# Patient Record
Sex: Male | Born: 1950
Health system: Southern US, Community
[De-identification: ages and names within clinical notes are randomized; demographics above are authoritative.]

## PROBLEM LIST (undated history)

## (undated) DIAGNOSIS — Z87442 Personal history of urinary calculi: Secondary | ICD-10-CM

## (undated) DIAGNOSIS — M1990 Unspecified osteoarthritis, unspecified site: Secondary | ICD-10-CM

## (undated) DIAGNOSIS — K219 Gastro-esophageal reflux disease without esophagitis: Secondary | ICD-10-CM

## (undated) DIAGNOSIS — N209 Urinary calculus, unspecified: Secondary | ICD-10-CM

## (undated) DIAGNOSIS — E041 Nontoxic single thyroid nodule: Secondary | ICD-10-CM

## (undated) DIAGNOSIS — Z973 Presence of spectacles and contact lenses: Secondary | ICD-10-CM

## (undated) DIAGNOSIS — E785 Hyperlipidemia, unspecified: Secondary | ICD-10-CM

## (undated) HISTORY — DX: Hyperlipidemia, unspecified: E78.5

## (undated) HISTORY — PX: UVULOPALATOPHARYNGOPLASTY: SHX827

---

## 1998-02-19 ENCOUNTER — Ambulatory Visit (HOSPITAL_COMMUNITY): Admission: RE | Admit: 1998-02-19 | Discharge: 1998-02-19 | Payer: Self-pay | Admitting: Gastroenterology

## 2000-02-17 ENCOUNTER — Ambulatory Visit (HOSPITAL_COMMUNITY): Admission: RE | Admit: 2000-02-17 | Discharge: 2000-02-17 | Payer: Self-pay | Admitting: Gastroenterology

## 2000-02-17 ENCOUNTER — Encounter (INDEPENDENT_AMBULATORY_CARE_PROVIDER_SITE_OTHER): Payer: Self-pay | Admitting: Specialist

## 2008-09-05 DIAGNOSIS — Z8669 Personal history of other diseases of the nervous system and sense organs: Secondary | ICD-10-CM

## 2008-09-05 HISTORY — DX: Personal history of other diseases of the nervous system and sense organs: Z86.69

## 2008-11-21 ENCOUNTER — Ambulatory Visit (HOSPITAL_BASED_OUTPATIENT_CLINIC_OR_DEPARTMENT_OTHER): Admission: RE | Admit: 2008-11-21 | Discharge: 2008-11-21 | Payer: Self-pay | Admitting: Otolaryngology

## 2008-11-30 ENCOUNTER — Ambulatory Visit: Payer: Self-pay | Admitting: Internal Medicine

## 2009-01-12 ENCOUNTER — Ambulatory Visit (HOSPITAL_COMMUNITY): Admission: RE | Admit: 2009-01-12 | Discharge: 2009-01-13 | Payer: Self-pay | Admitting: Otolaryngology

## 2009-01-12 ENCOUNTER — Encounter (INDEPENDENT_AMBULATORY_CARE_PROVIDER_SITE_OTHER): Payer: Self-pay | Admitting: Otolaryngology

## 2009-01-12 HISTORY — PX: UVULOPALATOPHARYNGOPLASTY (UPPP)/TONSILLECTOMY/SEPTOPLASTY: SHX6164

## 2009-04-18 ENCOUNTER — Encounter: Admission: RE | Admit: 2009-04-18 | Discharge: 2009-04-18 | Payer: Self-pay | Admitting: Family Medicine

## 2010-12-14 LAB — CBC
HCT: 47.6 % (ref 39.0–52.0)
Hemoglobin: 16.5 g/dL (ref 13.0–17.0)
MCHC: 34.8 g/dL (ref 30.0–36.0)
MCV: 97.3 fL (ref 78.0–100.0)
Platelets: 193 K/uL (ref 150–400)
RBC: 4.89 MIL/uL (ref 4.22–5.81)
RDW: 11.8 % (ref 11.5–15.5)
WBC: 6.2 K/uL (ref 4.0–10.5)

## 2010-12-14 LAB — BASIC METABOLIC PANEL
CO2: 26 mEq/L (ref 19–32)
Glucose, Bld: 126 mg/dL — ABNORMAL HIGH (ref 70–99)
Potassium: 4.2 mEq/L (ref 3.5–5.1)
Sodium: 139 mEq/L (ref 135–145)

## 2010-12-14 LAB — BASIC METABOLIC PANEL WITH GFR
BUN: 17 mg/dL (ref 6–23)
Calcium: 9.4 mg/dL (ref 8.4–10.5)
Chloride: 108 meq/L (ref 96–112)
Creatinine, Ser: 1.05 mg/dL (ref 0.4–1.5)
GFR calc Af Amer: >60 mL/min (ref >60)
GFR calc non Af Amer: >60 mL/min (ref >60)

## 2011-01-18 NOTE — Op Note (Signed)
NAME:  Casey Mullen, Casey Mullen NO.:  [id number]   MEDICAL RECORD NO.:  [id number]          PATIENT TYPE:  OIB   LOCATION:  3305                         FACILITY:  [facility]   PHYSICIAN:  Hermelinda Medicus, M.D.   DATE OF BIRTH:  1951-05-11   DATE OF PROCEDURE:  01/12/2009  DATE OF DISCHARGE:                               OPERATIVE REPORT   The patient is aware of the risk and gains, the risk of bleeding  postoperatively, the risk of congestion.  He will be kept overnight as  well and he is going to have a very sore throat for about 10 days and  also cannot travel long distance and also he is going to have a very  soft diet for approximately 10-12 days.   PREOPERATIVE DIAGNOSES:  Sleep apnea with septal deviation with  turbinate hypertrophy with tonsillar hypertrophy.   POSTOPERATIVE DIAGNOSES:  Sleep apnea with septal deviation with  turbinate hypertrophy with tonsillar hypertrophy.   OPERATION:  Septal reconstruction and turbinate reduction,  tonsillectomy, and uvulopalatoplasty.   OPERATOR:  Hermelinda Medicus, MD   ANESTHESIA:  General endotracheal with Dr. Krista Blue.   PROCEDURE:  The patient was placed in supine position under general  endotracheal anesthesia.  The nose was further anesthetized using 1%  Xylocaine with epinephrine and topical cocaine of turbinates.  We then  reduced using the butter knife then outfractured considerably.  The  concha bullosa were also noted and they were decreased in size using the  polyp forceps, but no mucous membrane was removed.  The septum was then  approached through a hemitransfixion incision on the right side carried  around the columella, and the columella was deviated on right angles out  of the columella normal position and this was replaced back in its  original position after the scar tissue was clear.  The ethmoid and  vomerine septum was also approached using the elevated mucous membrane  and then the Glorious Peach was used to take  anterior quadrilateral cartilage  strip and then the postethmoid and vomerine region was taken in its  deviated area with the open and close [employer].  Once this was  achieved, the septum was established to the midline.  The columella was  sutured in place using the 5-0 plain catgut and the incision was closed  with 4-0 plain catgut and then 5-0 plain catgut was again used as a  through-and-through septal suture back in the ethmoid vomerine region to  minimize any swelling or hematoma.  Telfa was placed within the nose,  and attention was then carried to the oral cavity where the Crowe-Davis  mouth gag was placed and the tonsils were removed using blunt, sharp,  and Bovie electrocoagulation dissection and hemostasis.  Once these were  removed and the uvula and palate was trimmed raising the contour of the  palate approximately 7 mm and the uvula was approximately 3 times as  normal size and this was taken down through a more normal size.  Once  this was completed, the anterior posterior mucous membrane was sutured  using 5-0 plain catgut  in the uvula.  The tonsillar beds were again  checked.  The stomach was suctioned.  The Telfa was removed from his nose and  anesthesia trumpets were placed, and the patient was awakened, tolerated  the procedure well.  He was doing well postoperatively.  He will be kept  overnight 23-hour observation and then would be seeing me in 5 days, 10  days, 3 weeks, 6 weeks, 3 months and 6 months.           ______________________________  Hermelinda Medicus, M.D.     JC/MEDQ  D:  01/12/2009  T:  01/13/2009  Job:  [id number]   cc:   Dario Guardian, M.D.

## 2011-01-18 NOTE — Procedures (Signed)
NAME:  Casey Mullen, Casey Mullen NO.:  [id number]   MEDICAL RECORD NO.:  [id number]          PATIENT TYPE:  OUT   LOCATION:  SLEEP CENTER                 FACILITY:  [facility]   PHYSICIAN:  Clinton D. Maple Hudson, MD, FCCP, FACPDATE OF BIRTH:  05-[birth year]-49   DATE OF STUDY:  11/21/2008                            NOCTURNAL POLYSOMNOGRAM   REFERRING PHYSICIAN:  Hermelinda Medicus, M.D.   INDICATION FOR STUDY:  Hypersomnia with sleep apnea.   EPWORTH SLEEPINESS SCORE:  2/24.   BMI 33.7.  Weight 235 pounds.   HOME MEDICATIONS:  None listed.   SLEEP ARCHITECTURE:  Total sleep time 207 minutes with sleep efficiency  54.6%.  Stage I was 15.9%, stage II 78%, stage III absent.  REM 6% of  total sleep time.  Sleep latency 16 minutes.  REM latency 164.5 minutes.  Awake after sleep onset 155 minutes.  Arousal index 29.9.   No bedtime medication was taken.   RESPIRATORY DATA:  Apnea-hypopnea index (AHI) 30.4 per hour.  A total of  105 events was scored including 68 obstructive apneas and 37 hypopneas.  Most of the events were recorded as non-supine.  REM AHI 67.2.  This was  a diagnostic NPSG protocol as ordered.   OXYGEN DATA:  Moderate snoring with oxygen desaturation to a nadir of  80%.  Mean oxygen saturation through the study was 93% on room air.   CARDIAC DATA:  Normal sinus rhythm.   MOVEMENT/PARASOMNIA:  No significant movement disturbance.  No bathroom  trips.   IMPRESSION/RECOMMENDATIONS:  1. Moderate obstructive sleep apnea/hypopnea syndrome, apnea-hypopnea      index 30.4 per hour.  Moderate snoring with oxygen desaturation to      a nadir of 80%.  2. This is a diagnostic NPSG study.  The patient can return for CPAP      titration if appropriate.  Otherwise consider alternative      management as clinically indicated.      Clinton D. Maple Hudson, MD, [facility], FACP  Diplomate, Biomedical engineer of Sleep Medicine  Electronically Signed     CDY/MEDQ  D:  11/29/2008 16:28:41  T:   11/30/2008 01:29:26  Job:  [id number]

## 2011-01-18 NOTE — H&P (Signed)
NAME:  Casey Mullen, Casey Mullen NO.:  [id number]   MEDICAL RECORD NO.:  [id number]          PATIENT TYPE:  OIB   LOCATION:  3305                         FACILITY:  [facility]   PHYSICIAN:  Hermelinda Medicus, M.D.   DATE OF BIRTH:  1950/11/25   DATE OF ADMISSION:  01/12/2009  DATE OF DISCHARGE:                              HISTORY & PHYSICAL   This patient is a 60 year old male who enters with sleep apnea issues  where he has an RDI and AHI of 30.4, oxygen desaturation down to 80%.  He has missed a considerable amount of sleep, has limited deep sleep and  has a septal deviation with nasal obstruction with turbinate hypertrophy  with large tonsils and a low uvula and palate.  He now enters for a  tonsillectomy, uvulopalatoplasty, and septal reconstruction, turbinate  reduction.  His past history is one of allergies to PENICILLIN, causing  a rash.  In his past also he is fatigued.  He has driven off the road  and taken naps and is quite sleep deprived.  Remainder of his review of  systems is quite unremarkable.  He has had some capped teeth.  He also  had a drink socially, never smoked.  He had a stress test several years  ago, but was fully normal.  Stomach ulcer history, had a colonoscopy.  Left shoulder gives him a bit of stiffness, but otherwise he has been in  excellent condition.  His lab work does show a glucose level of 126.   PHYSICAL EXAMINATION:  VITAL SIGNS:  Blood pressure is 126/72.  HEENT:  The ears are clear.  The tympanic membranes are clear and moved  well.  The oral cavity shows the large tonsils with low uvula and  palate, and the septum right anteriorly goes to columella way over to  the left and back to the right, it is almost at right angles.  The  remainder of the septum back further of the ethmoid and vomerine region  is also to the right turbinate hypertrophy as well.  The tonsils are  moderately large in size.  The palate was low.  The larynx was clear.  True cords, false cords, epiglottis, base of tongue, and lateral  pharyngeal walls were all clear of ulceration or mass, and true cord  mobility, gag reflex, tongue mobility, EOMs, and facial nerve were all  symmetrical, as is shoulder strength neurologically intact.  NECK:  Free of any thyromegaly, cervical adenopathy, or mass, but is  quite full.  CHEST:  Clear.  No rales, rhonchi, or wheezes.  CARDIOVASCULAR:  Normal S1 and S2.  No murmurs, rubs, or gallops.  EKG  noted.  EXTREMITIES:  Unremarkable.   INITIAL DIAGNOSIS:  Sleep apnea with septal deviation, turbinate  hypertrophy with tonsillar hypertrophy.           ______________________________  Hermelinda Medicus, M.D.     JC/MEDQ  D:  01/12/2009  T:  01/13/2009  Job:  [id number]   cc:   Dario Guardian, M.D.

## 2011-01-21 NOTE — Op Note (Signed)
[facility]  Patient:    Casey Mullen, Casey Mullen                        MRN: [id number] Proc. Date: 02/17/00 Adm. Date:  [id number] Disc. Date: [id number] Attending:  Orland Mustard CC:         Dario Guardian, M.D.                           Operative Report  PROCEDURE:  Esophagogastroduodenoscopy with biopsy and polypectomy.  MEDICATIONS:  Hurricane spray, fentanyl 75 mcg, Versed 7 mg IV.  INDICATION FOR PROCEDURE:  A nice 60 year old gentleman who had heme positive stool, upper abdominal pain and has a previous history of ulcers. When we saw him in the office with this history, he had had been on a proton pump inhibitor but with the development of pain and positive stools, I felt another endoscopy was appropriate.  DESCRIPTION OF PROCEDURE:  The procedure had been explained to the patient and consent obtained. With the patient in the left lateral decubitus position, the Olympus adult video endoscope was inserted blindly into the esophagus. The distal esophagus was quite reddened. There was a hiatal hernia, it was patent with free reflux. The stomach was entered, pylorus identified and passed. There was mild duodenitis with several erosions in the bulb. There were no ulcerations. The second duodenum was normal. The scope was withdrawn back in the stomach. The pyloric channel was normal. Biopsy of the antrum was taken for rapid urease test for Helicobacter. Near the incisor was a 3/4 cm sessile polyp that was ulcerated. In view of this heme positive stool, I felt that this should be remove since there was clearly and ulceration on the polyp. The mini snare was used, polyp was removed and sucked through the scope. There was no active bleeding at the time. The fundus and cardia were seen well on the retroflexed view. The scope was withdrawn. Again the findings of a hiatal hernia and a reddened esophagus were  confirmed upon withdrawal and no other lesions were  seen. The patient was maintained on low flow oxygen and pulse oximeter throughout the procedure with no obvious problem.  ASSESSMENT: 1. Hiatal hernia with gastroesophageal reflux disease. 2. Gastric polyp removed. 3. Duodenitis.  PLAN:  Will continue Nexium. See back in the office in 1 month and recheck his stools. DD:  02/17/00 TD:  02/19/00 Job: [id number]

## 2012-01-24 ENCOUNTER — Other Ambulatory Visit: Payer: Self-pay | Admitting: Family Medicine

## 2012-01-24 ENCOUNTER — Ambulatory Visit
Admission: RE | Admit: 2012-01-24 | Discharge: 2012-01-24 | Disposition: A | Discharge status: Home / Self Care | Payer: BC Managed Care – PPO | Source: Ambulatory Visit | Attending: Family Medicine | Admitting: Family Medicine

## 2012-01-24 ENCOUNTER — Other Ambulatory Visit: Payer: Self-pay | Admitting: *Deleted

## 2012-01-24 DIAGNOSIS — R109 Unspecified abdominal pain: Secondary | ICD-10-CM

## 2012-09-05 DIAGNOSIS — G4730 Sleep apnea, unspecified: Secondary | ICD-10-CM

## 2012-09-05 DIAGNOSIS — N201 Calculus of ureter: Secondary | ICD-10-CM

## 2012-09-05 DIAGNOSIS — K259 Gastric ulcer, unspecified as acute or chronic, without hemorrhage or perforation: Secondary | ICD-10-CM

## 2012-09-05 DIAGNOSIS — Z8711 Personal history of peptic ulcer disease: Secondary | ICD-10-CM

## 2012-09-05 DIAGNOSIS — N200 Calculus of kidney: Secondary | ICD-10-CM

## 2012-09-05 HISTORY — PX: ESOPHAGOGASTRODUODENOSCOPY: SHX1529

## 2012-09-05 HISTORY — DX: Personal history of peptic ulcer disease: Z87.11

## 2012-09-05 HISTORY — DX: Calculus of ureter: N20.1

## 2012-09-05 HISTORY — DX: Calculus of kidney: N20.0

## 2012-09-05 HISTORY — DX: Sleep apnea, unspecified: G47.30

## 2012-09-05 HISTORY — DX: Gastric ulcer, unspecified as acute or chronic, without hemorrhage or perforation: K25.9

## 2015-10-08 ENCOUNTER — Ambulatory Visit
Admission: RE | Admit: 2015-10-08 | Discharge: 2015-10-08 | Disposition: A | Discharge status: Home / Self Care | Payer: Self-pay | Source: Ambulatory Visit | Attending: Family Medicine | Admitting: Family Medicine

## 2015-10-08 ENCOUNTER — Ambulatory Visit
Admission: RE | Admit: 2015-10-08 | Discharge: 2015-10-08 | Disposition: A | Discharge status: Home / Self Care | Payer: Medicare Other | Source: Ambulatory Visit | Attending: Family Medicine | Admitting: Family Medicine

## 2015-10-08 ENCOUNTER — Other Ambulatory Visit: Payer: Self-pay | Admitting: Family Medicine

## 2015-10-08 DIAGNOSIS — M25562 Pain in left knee: Principal | ICD-10-CM

## 2015-10-08 DIAGNOSIS — M25561 Pain in right knee: Secondary | ICD-10-CM

## 2016-02-25 ENCOUNTER — Other Ambulatory Visit: Payer: Self-pay | Admitting: Family Medicine

## 2016-02-25 ENCOUNTER — Ambulatory Visit
Admission: RE | Admit: 2016-02-25 | Discharge: 2016-02-25 | Disposition: A | Discharge status: Home / Self Care | Payer: Medicare Other | Source: Ambulatory Visit | Attending: Family Medicine | Admitting: Family Medicine

## 2016-02-25 DIAGNOSIS — R059 Cough, unspecified: Secondary | ICD-10-CM

## 2016-02-25 DIAGNOSIS — R05 Cough: Secondary | ICD-10-CM

## 2016-03-25 ENCOUNTER — Ambulatory Visit
Admission: RE | Admit: 2016-03-25 | Discharge: 2016-03-25 | Disposition: A | Discharge status: Home / Self Care | Payer: Medicare Other | Source: Ambulatory Visit | Attending: Family Medicine | Admitting: Family Medicine

## 2016-03-25 ENCOUNTER — Other Ambulatory Visit: Payer: Self-pay | Admitting: Family Medicine

## 2016-03-25 DIAGNOSIS — J90 Pleural effusion, not elsewhere classified: Secondary | ICD-10-CM

## 2016-12-04 IMAGING — CR DG CHEST 2V
2 series · 2 of 2 positions shown · non-contrast
Comparison: 02/25/2016.

CLINICAL DATA: Pleural effusion.

EXAM:
CHEST  2 VIEW

[w chest pa]
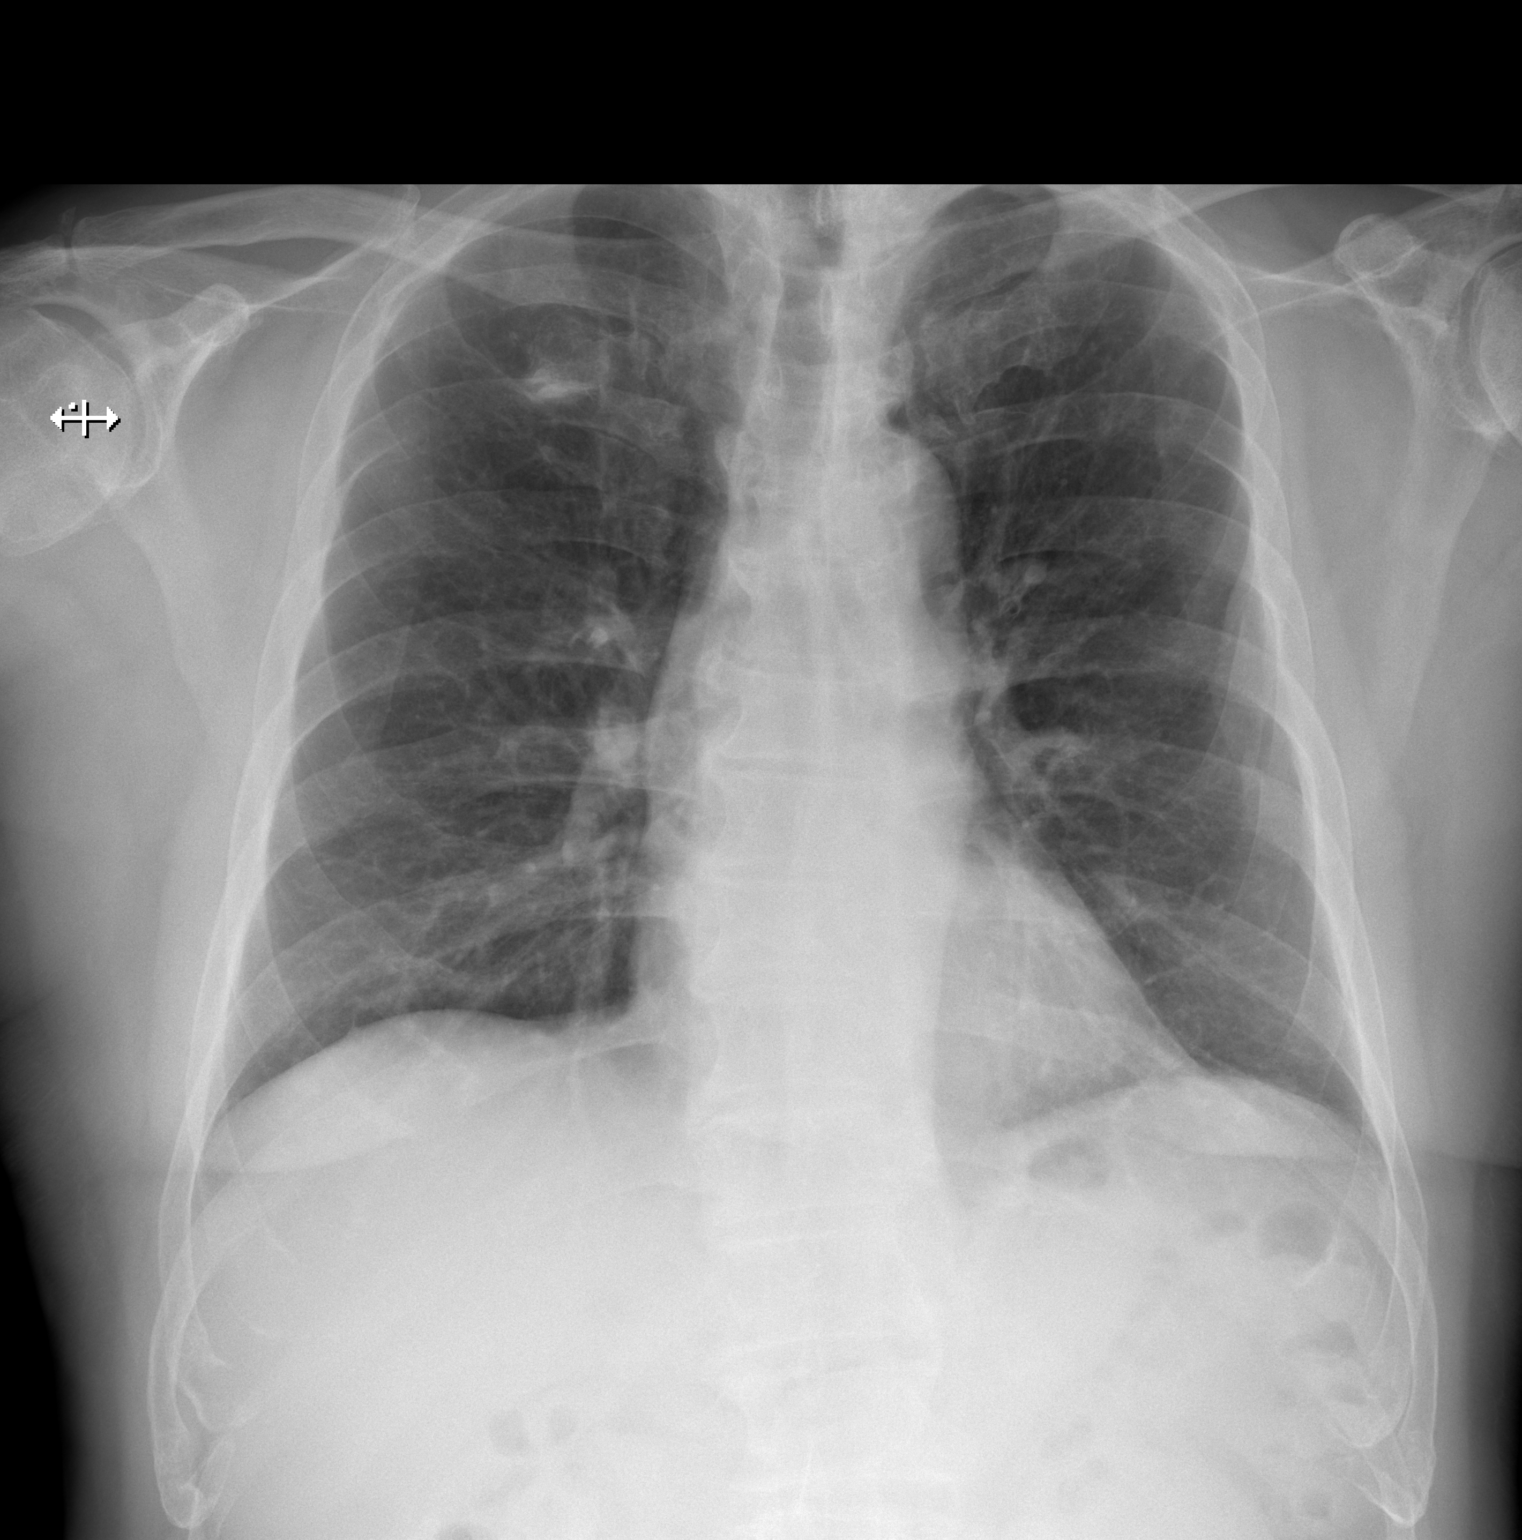

[w chest lat]
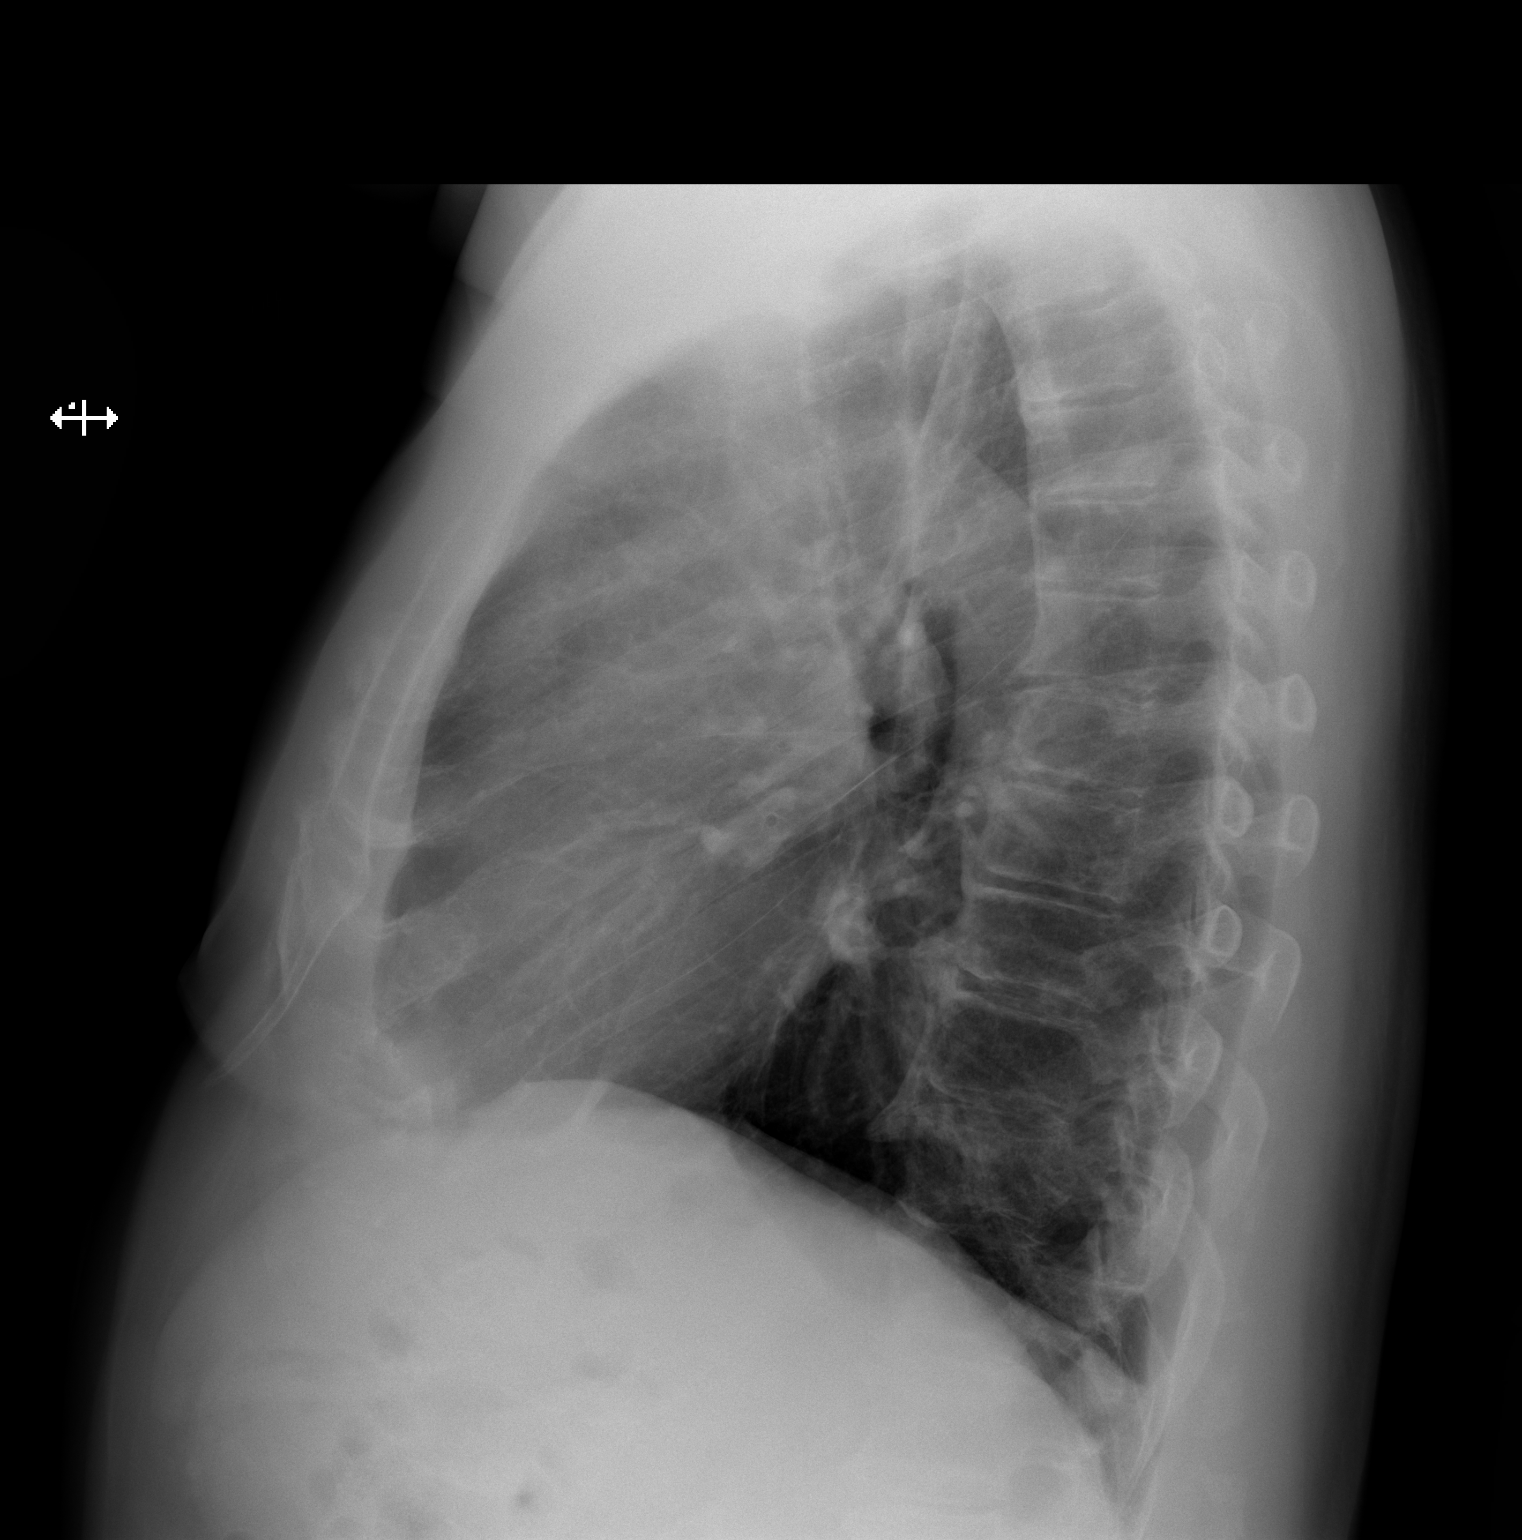

[2 of 2 positions shown; findings below may reference images not displayed]

FINDINGS: Mediastinum and hilar structures normal. Lungs are clear. Heart size
normal. No pleural effusion or pneumothorax. Near complete
resolution of previously identified left pleural effusion.
IMPRESSION: 1. Near complete resolution of previously identified left pleural
effusion.

2.  No acute or focal pulmonary infiltrate .

## 2017-09-05 HISTORY — PX: COLONOSCOPY: SHX174

## 2018-09-05 DIAGNOSIS — R718 Other abnormality of red blood cells: Secondary | ICD-10-CM

## 2018-09-05 DIAGNOSIS — I2510 Atherosclerotic heart disease of native coronary artery without angina pectoris: Secondary | ICD-10-CM

## 2018-09-05 HISTORY — DX: Atherosclerotic heart disease of native coronary artery without angina pectoris: I25.10

## 2018-09-05 HISTORY — DX: Other abnormality of red blood cells: R71.8

## 2018-10-25 ENCOUNTER — Other Ambulatory Visit: Payer: Self-pay | Admitting: Family Medicine

## 2018-10-25 DIAGNOSIS — E785 Hyperlipidemia, unspecified: Secondary | ICD-10-CM

## 2018-11-02 ENCOUNTER — Ambulatory Visit
Admission: RE | Admit: 2018-11-02 | Discharge: 2018-11-02 | Disposition: A | Discharge status: Home / Self Care | Payer: Medicare Other | Source: Ambulatory Visit | Attending: Family Medicine | Admitting: Family Medicine

## 2018-11-02 DIAGNOSIS — E785 Hyperlipidemia, unspecified: Secondary | ICD-10-CM

## 2018-11-30 ENCOUNTER — Telehealth: Payer: Self-pay | Admitting: Cardiovascular Disease

## 2018-11-30 NOTE — Telephone Encounter (Signed)
New Message:      Pt wants to know if he still have his appt for 12-04-18.?

## 2018-12-03 ENCOUNTER — Telehealth: Payer: Self-pay

## 2018-12-03 NOTE — Telephone Encounter (Signed)
   Cardiac Questionnaire:    Since your last visit or hospitalization:    1. Have you been having new or worsening chest pain? NO   2. Have you been having new or worsening shortness of breath? NO 3. Have you been having new or worsening leg swelling, wt gain, or increase in abdominal girth (pants fitting more tightly)? NO. NO; WEIGHT LOSS ON 'KETO DIET'. NO   4. Have you had any passing out spells? NO    *A YES to any of these questions would result in the appointment being kept. *If all the answers to these questions are NO, we should indicate that given the current situation regarding the worldwide coronarvirus pandemic, at the recommendation of the CDC, we are looking to limit gatherings in our waiting area, and thus will reschedule their appointment beyond four weeks from today.    PATIENT DENIES SYMPTOMS. HE STATES HE HAS DONE DIETARY AND LIFESTYLE CHANGES AND HAS LOST A SIGNIFICANT AMOUNT OF WEIGHT RECENTLY. PATIENT AWARE THAT DR. Gwenlyn Found RECOMMENDS F/U IN 3 MONTHS SINCE HE IS ASYMPTOMATIC. PT AGREEABLE WITH THIS. INFORMED PT THAT MESSAGE WOULD BE ROUTED TO SCHEDULING FOR APPT RESCHEDULE.  _____________   [id number] Pre-Screening Questions:  . Do you currently have a fever? NO (yes = cancel and refer to pcp for e-visit) . Have you recently travelled on a cruise, internationally, or to [place], [place], [place], [place], [place], or [place], [place] [employer]) ? NO; WIFE HAS TRAVELED TO [place] RECENTLY TO VISIT BROTHER (yes = cancel, stay home, monitor symptoms, and contact pcp or initiate e-visit if symptoms develop) . Have you been in contact with someone that is currently pending confirmation of Covid19 testing or has been confirmed to have the [place] virus?  NO (yes = cancel, stay home, away from tested individual, monitor symptoms, and contact pcp or initiate e-visit if symptoms develop) . Are you currently experiencing fatigue or cough? NO (yes = pt should be prepared to have a mask placed at the time  of their visit).

## 2018-12-04 ENCOUNTER — Ambulatory Visit: Payer: Medicare Other | Admitting: Cardiovascular Disease

## 2019-01-31 ENCOUNTER — Telehealth: Payer: Self-pay | Admitting: *Deleted

## 2019-01-31 NOTE — Telephone Encounter (Signed)
Left message for patient to call and reschedule 02/05/19 appointment with Dr. Gwenlyn Found (Dr. Gwenlyn Found has a meeting)

## 2019-02-01 NOTE — Telephone Encounter (Signed)
Left message for patient to call and reschedule 02/05/19 11:30 am visit with Dr. Gwenlyn Found (Dr. Gwenlyn Found has a meeting)

## 2019-02-05 ENCOUNTER — Ambulatory Visit: Payer: Medicare Other | Admitting: Cardiovascular Disease

## 2019-02-06 ENCOUNTER — Telehealth: Payer: Self-pay | Admitting: *Deleted

## 2019-02-06 NOTE — Telephone Encounter (Signed)
Patient is returning a call. °

## 2019-02-06 NOTE — Telephone Encounter (Signed)
LVM for patient to call me back 

## 2019-02-06 NOTE — Telephone Encounter (Signed)
    COVID-19 Pre-Screening Questions:  . In the past 7 to 10 days have you had a cough,  shortness of breath, headache, congestion, fever (100 or greater) body aches, chills, sore throat, or sudden loss of taste or sense of smell? NO . Have you been around anyone with known Covid 19. NO . Have you been around anyone who is awaiting Covid 19 test results in the past 7 to 10 days? NO . Have you been around anyone who has been exposed to Covid 19, or has mentioned symptoms of Covid 19 within the past 7 to 10 days? NO  If you have any concerns/questions about symptoms patients report during screening (either on the phone or at threshold). Contact the provider seeing the patient or DOD for further guidance.  If neither are available contact a member of the leadership team.     Spoke with patient and he did not want to have a virtual visit on February 13, 2019 so we rescheduled his appointment for Friday February 15, 2019 at 2:30PM which is Dr. Kennon Holter DOD day.

## 2019-02-13 ENCOUNTER — Ambulatory Visit: Payer: Medicare Other | Admitting: Cardiovascular Disease

## 2019-02-15 ENCOUNTER — Encounter: Payer: Self-pay | Admitting: Cardiovascular Disease

## 2019-02-15 ENCOUNTER — Other Ambulatory Visit: Payer: Self-pay

## 2019-02-15 ENCOUNTER — Ambulatory Visit (INDEPENDENT_AMBULATORY_CARE_PROVIDER_SITE_OTHER): Payer: Medicare Other | Admitting: Cardiovascular Disease

## 2019-02-15 DIAGNOSIS — R931 Abnormal findings on diagnostic imaging of heart and coronary circulation: Secondary | ICD-10-CM

## 2019-02-15 DIAGNOSIS — E785 Hyperlipidemia, unspecified: Secondary | ICD-10-CM | POA: Insufficient documentation

## 2019-02-15 DIAGNOSIS — E782 Mixed hyperlipidemia: Secondary | ICD-10-CM | POA: Diagnosis not present

## 2019-02-15 NOTE — Progress Notes (Signed)
02/15/2019 Casey Mullen   11-13-1950  [id number]  Primary Physician Harlan Stains, MD Primary Cardiologist: Lorretta Harp MD Lupe Carney, [place]  HPI:  Casey Mullen is a 68 y.o. thin appearing married Asian male father of 1 son, grandfather 3 grandchildren is retired from working in [employer] and was referred by Dr. Harlan Stains for cardiovascular valuation because of hyperlipidemia and elevated coronary calcium score.  He has seen Dr. Tollie Eth in the past as well.  He has no cardiac risk factors other than mild hyperlipidemia with recent lipid profile performed 02/01/2019 revealing total cholesterol 220, LDL 155 and HDL 53.  He does hike daily, play pickle ball and walks 45 miles without symptoms.  He had a recent coronary calcium score performed 11/03/2023 with calcium noted in the RCA and LAD.   No outpatient medications have been marked as taking for the 02/15/19 encounter (Office Visit) with Lorretta Harp, MD.     Not on File  Social History   Socioeconomic History  . Marital status: Married    Spouse name: Not on file  . Number of children: Not on file  . Years of education: Not on file  . Highest education level: Not on file  Occupational History  . Not on file  Social Needs  . Financial resource strain: Not on file  . Food insecurity    Worry: Not on file    Inability: Not on file  . Transportation needs    Medical: Not on file    Non-medical: Not on file  Tobacco Use  . Smoking status: Never Smoker  . Smokeless tobacco: Never Used  Substance and Sexual Activity  . Alcohol use: Not on file  . Drug use: Not on file  . Sexual activity: Not on file  Lifestyle  . Physical activity    Days per week: Not on file    Minutes per session: Not on file  . Stress: Not on file  Relationships  . Social Herbalist on phone: Not on file    Gets together: Not on file    Attends religious service: Not on file    Active member of club or  organization: Not on file    Attends meetings of clubs or organizations: Not on file    Relationship status: Not on file  . Intimate partner violence    Fear of current or ex partner: Not on file    Emotionally abused: Not on file    Physically abused: Not on file    Forced sexual activity: Not on file  Other Topics Concern  . Not on file  Social History Narrative  . Not on file     Review of Systems: General: negative for chills, fever, night sweats or weight changes.  Cardiovascular: negative for chest pain, dyspnea on exertion, edema, orthopnea, palpitations, paroxysmal nocturnal dyspnea or shortness of breath Dermatological: negative for rash Respiratory: negative for cough or wheezing Urologic: negative for hematuria Abdominal: negative for nausea, vomiting, diarrhea, bright red blood per rectum, melena, or hematemesis Neurologic: negative for visual changes, syncope, or dizziness All other systems reviewed and are otherwise negative except as noted above.    Blood pressure 134/62, pulse 63, temperature 98.4 F (36.9 C), height 5\' 10"  (1.778 m), weight 173 lb (78.5 kg).  General appearance: alert and no distress Neck: no adenopathy, no carotid bruit, no JVD, supple, symmetrical, trachea midline and thyroid not enlarged, symmetric, no tenderness/mass/nodules  Lungs: clear to auscultation bilaterally Heart: regular rate and rhythm, S1, S2 normal, no murmur, click, rub or gallop Extremities: extremities normal, atraumatic, no cyanosis or edema Pulses: 2+ and symmetric Skin: Skin color, texture, turgor normal. No rashes or lesions Neurologic: Alert and oriented X 3, normal strength and tone. Normal symmetric reflexes. Normal coordination and gait  EKG normal sinus rhythm at 63 without ST or T wave changes.  I personally reviewed this EKG.  ASSESSMENT AND PLAN:   Hyperlipidemia History of hyperlipidemia not on statin therapy that got worse on keto diet.  He changed his diet  back to his original diet and most recently his lipid profile performed 02/01/2019 revealed a total cholesterol 220, LDL of 155 and HDL 53.  He thinks that it will continue to get better than this.  We will hold off on starting a statin drug and recheck a lipid liver profile in 6 months.  Elevated coronary artery calcium score Coronary calcium score 785 with calcium in the LAD and RCA.  He is totally asymptomatic and actually exercises a significant amount on a daily basis without symptoms.  I do not think he needs a functional study at this time.  I would however suggest aggressive risk factor modification with LDL of less than 70.      Lorretta Harp MD FACP,FACC,FAHA, [facility] 02/15/2019 3:10 PM

## 2019-02-15 NOTE — Assessment & Plan Note (Signed)
History of hyperlipidemia not on statin therapy that got worse on keto diet.  He changed his diet back to his original diet and most recently his lipid profile performed 02/01/2019 revealed a total cholesterol 220, LDL of 155 and HDL 53.  He thinks that it will continue to get better than this.  We will hold off on starting a statin drug and recheck a lipid liver profile in 6 months.

## 2019-02-15 NOTE — Patient Instructions (Signed)
Medication Instructions:  Your physician recommends that you continue on your current medications as directed. Please refer to the Current Medication list given to you today.  If you need a refill on your cardiac medications before your next appointment, please call your pharmacy.   Lab work: Your physician recommends that you return for lab work in 6 MONTHS: Fasting Lipid Profile; Liver Function Test  If you have labs (blood work) drawn today and your tests are completely normal, you will receive your results only by: Marland Kitchen MyChart Message (if you have MyChart) OR . A paper copy in the mail If you have any lab test that is abnormal or we need to change your treatment, we will call you to review the results.  Testing/Procedures: NONE  Follow-Up: At [facility], you and your health needs are our priority.  As part of our continuing mission to provide you with exceptional heart care, we have created designated Provider Care Teams.  These Care Teams include your primary Cardiologist (physician) and Advanced Practice Providers (APPs -  Physician Assistants and Nurse Practitioners) who all work together to provide you with the care you need, when you need it. You will need a follow up appointment in 6 months.  Please call our office 2 months in advance to schedule this appointment. YOU WILL NEED TO HAVE YOUR LAB WORK COMPLETED BEFORE YOUR NEXT APPOINTMENT WITH DR. Gwenlyn Found.

## 2019-02-15 NOTE — Assessment & Plan Note (Signed)
Coronary calcium score 785 with calcium in the LAD and RCA.  He is totally asymptomatic and actually exercises a significant amount on a daily basis without symptoms.  I do not think he needs a functional study at this time.  I would however suggest aggressive risk factor modification with LDL of less than 70.

## 2019-07-04 ENCOUNTER — Telehealth: Payer: Self-pay | Admitting: Hematology and Oncology

## 2019-07-04 NOTE — Telephone Encounter (Signed)
Received a new hem referral from Dr. Dema Severin for dx of macrocytic anemia. Casey Mullen has been cld and scheduled to see Dr. Lorenso Courier on 11/6 at 2pm. Pt aware to arrive 15 minutes early.

## 2019-07-11 NOTE — Progress Notes (Signed)
[place] [phone]   Fax:([phone] [place] NOTE  Patient Care Team: Harlan Stains, MD as PCP - General (Family Medicine)  Hematological/Oncological History # Macrocytosis with Mild Anemia 1) 05/31/2019: WBC 4.4, Hgb 13.0, MCV 103.0 (nml 80-94), Plt 179 2) 06/28/2019: WBC 5.5, Hgb 12.2 (nml 13.0-17.0), MCV 101.4 (nml 80-94), Plt 183 3) 07/11/2019: Establish Care with Dr. Lorenso Courier   CHIEF COMPLAINTS/PURPOSE OF CONSULTATION:  Macrocytosis with Mild Anemia  HISTORY OF PRESENTING ILLNESS:  Casey Mullen 68 y.o. male with medical history significant for HLD who presents for evaluation of a new macrocytic anemia.   On review of outside records, Casey Mullen follows with his PCP who orders routine CBCs for his HLD treatment. During one of his last visit on 05/31/2019 he was found to have bloodwork showing WBC 4.4, Hgb 13.0, MCV 103.0 (nml 80-94), Plt 179. A subsequent one month recheck on 06/28/2019 showed WBC 5.5, Hgb 12.2 (nml 13.0-17.0), MCV 101.4 (nml 80-94), Plt 183. Due to concern for this new development he was referred to our clinic for further evaluation and management.  Of note, he had a CBC at [facility] on 01/10/2019 which showed WBC 6.3, Hgb 16.5, Plt 193, MCV 97.3 (nml).  On exam today Casey Mullen notes he feels good. He is quite fit and active, noting he plays pickle pall at least 6 times per week and does 4-6 mile hikes at a time. He is taking numerous nutritional supplements including krill oil, tumeric, vitamin C, vitamin D and joint supplements. He was just on a keto diet for the last year and stopped a few weeks ago. His diet in that time was rich in red meats, vegetables, and nuts. He is a nonsmoker and has not had EtOH is 20-30 years. He has no family history of blood disorders.  A 10 point ROS was listed below.   MEDICAL HISTORY:  No past medical history on file.  SURGICAL HISTORY: No past surgical history on file.  SOCIAL  HISTORY: Social History   Socioeconomic History  . Marital status: Married    Spouse name: Not on file  . Number of children: Not on file  . Years of education: Not on file  . Highest education level: Not on file  Occupational History  . Not on file  Social Needs  . Financial resource strain: Not on file  . Food insecurity    Worry: Not on file    Inability: Not on file  . Transportation needs    Medical: Not on file    Non-medical: Not on file  Tobacco Use  . Smoking status: Never Smoker  . Smokeless tobacco: Never Used  Substance and Sexual Activity  . Alcohol use: Not on file  . Drug use: Not on file  . Sexual activity: Not on file  Lifestyle  . Physical activity    Days per week: Not on file    Minutes per session: Not on file  . Stress: Not on file  Relationships  . Social Herbalist on phone: Not on file    Gets together: Not on file    Attends religious service: Not on file    Active member of club or organization: Not on file    Attends meetings of clubs or organizations: Not on file    Relationship status: Not on file  . Intimate partner violence    Fear of current or ex partner: Not on file  Emotionally abused: Not on file    Physically abused: Not on file    Forced sexual activity: Not on file  Other Topics Concern  . Not on file  Social History Narrative  . Not on file    FAMILY HISTORY: No family history on file.  ALLERGIES:  has no allergies on file.  MEDICATIONS:  No current outpatient medications on file.   No current facility-administered medications for this visit.     REVIEW OF SYSTEMS:   Constitutional: ( - ) fevers, ( - )  chills , ( - ) night sweats Eyes: ( - ) blurriness of vision, ( - ) double vision, ( - ) watery eyes Ears, nose, mouth, throat, and face: ( - ) mucositis, ( - ) sore throat Respiratory: ( - ) cough, ( - ) dyspnea, ( - ) wheezes Cardiovascular: ( - ) palpitation, ( - ) chest discomfort, ( - ) lower  extremity swelling Gastrointestinal:  ( - ) nausea, ( - ) heartburn, ( - ) change in bowel habits Skin: ( - ) abnormal skin rashes Lymphatics: ( - ) new lymphadenopathy, ( - ) easy bruising Neurological: ( - ) numbness, ( - ) tingling, ( - ) new weaknesses Behavioral/Psych: ( - ) mood change, ( - ) new changes  All other systems were reviewed with the patient and are negative.  PHYSICAL EXAMINATION: ECOG PERFORMANCE STATUS: 0 - Asymptomatic  There were no vitals filed for this visit. There were no vitals filed for this visit.  GENERAL: well appearing elderly male, appears far younger than stated age.  SKIN: skin color, texture, turgor are normal, no rashes or significant lesions EYES: conjunctiva are pink and non-injected, sclera clear LUNGS: clear to auscultation and percussion with normal breathing effort HEART: regular rate & rhythm and no murmurs and no lower extremity edema ABDOMEN: soft, non-tender, non-distended, normal bowel sounds Musculoskeletal: no cyanosis of digits and no clubbing  PSYCH: alert & oriented x 3, fluent speech NEURO: no focal motor/sensory deficits  LABORATORY DATA:  I have reviewed the data as listed Lab Results  Component Value Date   WBC 6.2 01/09/2009   HGB 16.5 01/09/2009   HCT 47.6 01/09/2009   MCV 97.3 01/09/2009   PLT 193 01/09/2009    PATHOLOGY: None to review  BLOOD FILM:  I personally reviewed the patient's peripheral blood smear today.  There was no peripheral blast.  The white blood cells showed neutrophils with 4-5 segments per cell with no premature forms. Red blood cells were of normal morphology. There was no schistocytosis or anisocytosis.  The platelets are of normal size and I have verified that there were no platelet clumping.  RADIOGRAPHIC STUDIES: None to review  ASSESSMENT & PLAN Casey Mullen is a 68 year old male with medical history significant for HLD who presents with an elevated MCV and mild anemia.   The  differential for macrocytic anemia is broad, and includes liver disease/EtOH use, Vitamin B12/folate deficiency, autoimmune hemolytic anemia, or a hematological malignancy such as MDS. On discussion today Casey Mullen notes no current EtOH use. Outside labs showed normal serum B12/folate, however the confirmatory MMA/homocysteine was not performed. Of note, we only have 2 elevated MCVs on record and the cell size changed considerably between the two measurements (from 103 down to 101.4), both of which were considerably larger than the reference range of 80-94.   Labwork today will focus on r/o the most common nutritional and hemolytic etiologies. SPEP and SFLC  are recommended as nearly 1 in 5 of these cases can have a monoclonal gammopathy. If no clear cause is identified then it would be prudent to proceed with a bone marrow biopsy to r/o hematological malignancy.   #Macrocytosis with Mild Anemia --today will order CBC w/ diff, CMP, LDH, Haptoglobin, DAT, Vitamin B12, folate, MMA, copper and homocysteine. Additional labs to include reticulocyte count and peripheral blood film --as part of the initial malignancy workup will include SPEP and SFLC (monoclonal paraproteinemia in 22.7% of cases in one study Blood (2008) 112 (11): 3449) --peripheral smear results were consistent with hypersegementation, which may be an indication of Vitamin B12 deficiency. Will await results of MMA to confirm. --with a persistent anemia it would be prudent to perform a bone marrow biopsy if none of the above labs yield a clear etiology for his macrocytic anemia --RTC in 3 months if etiology is found. RTC sooner for Bmbx if no source is identified.   No orders of the defined types were placed in this encounter.   All questions were answered. The patient knows to call the clinic with any problems, questions or concerns.  A total of more than 60 minutes were spent face-to-face with the patient during this encounter and over  half of that time was spent on counseling and coordination of care as outlined above.   Ledell Peoples, MD Department of Hematology/Oncology [place] at [facility] Phone: [phone] Pager: [phone] Email: [email or web address]   07/11/2019 6:57 PM  Literature Support:  Brion Aliment, Dagher GA, Dulanto JV, [place], Kuriakose P. Unexplained macrocytosis. [place] 2013 Feb;106(2):121-5. doi: [id number] cdf. PMID: [id number].   --The probability of a bone marrow biopsy to establish a diagnosis of a primary disorder was 33.3% in patients with macrocytosis without anemia compared with 75% in patients with macrocytosis with anemia. Patients with unexplained macrocytosis still require close follow-up. We suggest a strategy of follow-up with blood cell counting every 6 months. Bone marrow biopsy should be performed when cytopenias are present because this approach may provide a higher yield of diagnosis and aid with therapeutic decisions.   The Significance of Unexplained Macrocytosis. Sallyanne Havers, MD, Barbara Cower, MD, Nigel Berthold, MD. Blood (2008) 112 (11): 3449.   --Unexplained macrocytosis may not be a benign condition and requires close follow-up as up to 27.9% of patients will develop worsening cytopenias (16.3%) or will be ultimately diagnosed with a primary bone marrow disorder (11.6%). We suggest a strategy of follow-up with CBCs every 6 months. Given the high incidence of monoclonal paraproteinemia in these patients, we propose that MPEV should be considered as part of the initial work-up for macrocytosis.

## 2019-07-12 ENCOUNTER — Inpatient Hospital Stay: Payer: Medicare Other | Attending: Hematology and Oncology | Admitting: Hematology and Oncology

## 2019-07-12 ENCOUNTER — Encounter: Payer: Self-pay | Admitting: Hematology and Oncology

## 2019-07-12 ENCOUNTER — Inpatient Hospital Stay: Payer: Medicare Other

## 2019-07-12 ENCOUNTER — Other Ambulatory Visit: Payer: Self-pay

## 2019-07-12 VITALS — BP 133/72 | HR 65 | Temp 98.2°F | Resp 17 | Ht 70.0 in | Wt 180.0 lb

## 2019-07-12 DIAGNOSIS — D539 Nutritional anemia, unspecified: Secondary | ICD-10-CM

## 2019-07-12 DIAGNOSIS — E785 Hyperlipidemia, unspecified: Secondary | ICD-10-CM | POA: Insufficient documentation

## 2019-07-12 LAB — CMP (CANCER CENTER ONLY)
ALT: 21 U/L (ref 0–44)
AST: 22 U/L (ref 15–41)
Albumin: 4.2 g/dL (ref 3.5–5.0)
Alkaline Phosphatase: 77 U/L (ref 38–126)
Anion gap: 9 (ref 5–15)
BUN: 28 mg/dL — ABNORMAL HIGH (ref 8–23)
CO2: 25 mmol/L (ref 22–32)
Calcium: 9.5 mg/dL (ref 8.9–10.3)
Chloride: 106 mmol/L (ref 98–111)
Creatinine: 1.27 mg/dL — ABNORMAL HIGH (ref 0.61–1.24)
GFR, Est AFR Am: >60 mL/min (ref >60)
GFR, Estimated: 57 mL/min — ABNORMAL LOW (ref >60)
Glucose, Bld: 103 mg/dL — ABNORMAL HIGH (ref 70–99)
Potassium: 4 mmol/L (ref 3.5–5.1)
Sodium: 140 mmol/L (ref 135–145)
Total Bilirubin: 0.9 mg/dL (ref 0.3–1.2)
Total Protein: 6.8 g/dL (ref 6.5–8.1)

## 2019-07-12 LAB — CBC WITH DIFFERENTIAL (CANCER CENTER ONLY)
Abs Immature Granulocytes: 0.01 10*3/uL (ref 0.00–0.07)
Basophils Absolute: 0 10*3/uL (ref 0.0–0.1)
Basophils Relative: 1 %
Eosinophils Absolute: 0.2 10*3/uL (ref 0.0–0.5)
Eosinophils Relative: 3 %
HCT: 36.5 % — ABNORMAL LOW (ref 39.0–52.0)
Hemoglobin: 12.2 g/dL — ABNORMAL LOW (ref 13.0–17.0)
Immature Granulocytes: 0 %
Lymphocytes Relative: 22 %
Lymphs Abs: 1.4 10*3/uL (ref 0.7–4.0)
MCH: 34.1 pg — ABNORMAL HIGH (ref 26.0–34.0)
MCHC: 33.4 g/dL (ref 30.0–36.0)
MCV: 102 fL — ABNORMAL HIGH (ref 80.0–100.0)
Monocytes Absolute: 0.5 10*3/uL (ref 0.1–1.0)
Monocytes Relative: 8 %
Neutro Abs: 4.1 10*3/uL (ref 1.7–7.7)
Neutrophils Relative %: 66 %
Platelet Count: 163 10*3/uL (ref 150–400)
RBC: 3.58 MIL/uL — ABNORMAL LOW (ref 4.22–5.81)
RDW: 11.4 % — ABNORMAL LOW (ref 11.5–15.5)
WBC Count: 6.2 10*3/uL (ref 4.0–10.5)
nRBC: 0 % (ref 0.0–0.2)

## 2019-07-12 LAB — LACTATE DEHYDROGENASE: LDH: 111 U/L (ref 98–192)

## 2019-07-12 LAB — RETICULOCYTES
Immature Retic Fract: 6.8 % (ref 2.3–15.9)
RBC.: 3.56 MIL/uL — ABNORMAL LOW (ref 4.22–5.81)
Retic Count, Absolute: 67.6 10*3/uL (ref 19.0–186.0)
Retic Ct Pct: 1.9 % (ref 0.4–3.1)

## 2019-07-12 LAB — VITAMIN B12: Vitamin B-12: 505 pg/mL (ref 180–914)

## 2019-07-12 LAB — SAVE SMEAR(SSMR), FOR PROVIDER SLIDE REVIEW

## 2019-07-12 LAB — FOLATE: Folate: 13.1 ng/mL (ref >5.9)

## 2019-07-13 LAB — HAPTOGLOBIN: Haptoglobin: 28 mg/dL — ABNORMAL LOW (ref 32–363)

## 2019-07-13 LAB — DIRECT ANTIGLOBULIN TEST (NOT AT ARMC)
DAT, IgG: NEGATIVE
DAT, complement: NEGATIVE

## 2019-07-13 LAB — HOMOCYSTEINE: Homocysteine: 13 umol/L (ref 0.0–17.2)

## 2019-07-14 LAB — FOLATE RBC
Folate, Hemolysate: 509 ng/mL
Folate, RBC: 1368 ng/mL (ref >498)
Hematocrit: 37.2 % — ABNORMAL LOW (ref 37.5–51.0)

## 2019-07-15 LAB — PROTEIN ELECTROPHORESIS, SERUM, WITH REFLEX
A/G Ratio: 1.6 (ref 0.7–1.7)
Albumin ELP: 3.7 g/dL (ref 2.9–4.4)
Alpha-1-Globulin: 0.2 g/dL (ref 0.0–0.4)
Alpha-2-Globulin: 0.4 g/dL (ref 0.4–1.0)
Beta Globulin: 0.8 g/dL (ref 0.7–1.3)
Gamma Globulin: 0.9 g/dL (ref 0.4–1.8)
Globulin, Total: 2.3 g/dL (ref 2.2–3.9)
Total Protein ELP: 6 g/dL (ref 6.0–8.5)

## 2019-07-15 LAB — KAPPA/LAMBDA LIGHT CHAINS
Kappa free light chain: 23 mg/L — ABNORMAL HIGH (ref 3.3–19.4)
Kappa, lambda light chain ratio: 1.06 (ref 0.26–1.65)
Lambda free light chains: 21.6 mg/L (ref 5.7–26.3)

## 2019-07-15 LAB — IRON AND TIBC
Iron: 67 ug/dL (ref 42–163)
Saturation Ratios: 24 % (ref 20–55)
TIBC: 274 ug/dL (ref 202–409)
UIBC: 207 ug/dL (ref 117–376)

## 2019-07-15 LAB — FERRITIN: Ferritin: 659 ng/mL — ABNORMAL HIGH (ref 24–336)

## 2019-07-15 LAB — TSH: TSH: 0.893 u[IU]/mL (ref 0.320–4.118)

## 2019-07-17 LAB — COPPER, SERUM: Copper: 75 ug/dL (ref 72–166)

## 2019-07-19 LAB — METHYLMALONIC ACID, SERUM: Methylmalonic Acid, Quantitative: 87 nmol/L (ref 0–378)

## 2019-07-30 ENCOUNTER — Telehealth: Payer: Self-pay | Admitting: Hematology and Oncology

## 2019-07-30 DIAGNOSIS — D539 Nutritional anemia, unspecified: Secondary | ICD-10-CM

## 2019-07-30 NOTE — Telephone Encounter (Signed)
Patient returned call to discuss lab findings.  Noted that macrocytosis is not explained by current labs and next best step is a bone marrow biopsy.  Described the procedure and steps moving forward to the patient. All questions and concerns were addressed.  Will place order for Bmbx and message the schedulers.  Ledell Peoples, MD Department of Hematology/Oncology [place] at [facility] Phone: [phone] Pager: [phone] Email: [email or web address]

## 2019-07-30 NOTE — Telephone Encounter (Signed)
Called Mr. Cromwell to discuss the findings of his labwork. He has a macrocytic anemia. His nutritional labs were normal and there was no clear cause of his macrocytosis on extensive bloodwork. He was noted to have an elevated ferritin, likely a marker of inflammation.  Due to these findings I recommend a bone marrow biopsy to r/o a myelodysplastic syndrome and assure there is no malignant cause of his macrocytosis.  Patient did not answer phone. Left message requesting call back.  Ledell Peoples, MD Department of Hematology/Oncology [place] at [facility] Phone: [phone] Pager: [phone] Email: [email or web address]

## 2019-08-06 ENCOUNTER — Telehealth: Payer: Self-pay

## 2019-08-06 NOTE — Telephone Encounter (Signed)
Spoke with patient and scheduled a bone marrow biopsy for 08/08/19 at 7:30 am with LCC/NP.  High priority message sent to schedule patient.  Spoke with Estill Bamberg in cytometry to schedule lab at 8 am.

## 2019-08-07 ENCOUNTER — Other Ambulatory Visit: Payer: Self-pay | Admitting: *Deleted

## 2019-08-07 DIAGNOSIS — D539 Nutritional anemia, unspecified: Secondary | ICD-10-CM

## 2019-08-08 ENCOUNTER — Inpatient Hospital Stay: Payer: Medicare Other | Attending: Hematology and Oncology | Admitting: Adult Health

## 2019-08-08 ENCOUNTER — Other Ambulatory Visit: Payer: Self-pay

## 2019-08-08 ENCOUNTER — Other Ambulatory Visit: Payer: Medicare Other

## 2019-08-08 ENCOUNTER — Inpatient Hospital Stay: Payer: Medicare Other

## 2019-08-08 ENCOUNTER — Encounter: Payer: Self-pay | Admitting: Adult Health

## 2019-08-08 VITALS — BP 127/68 | HR 60 | Temp 98.9°F | Resp 16

## 2019-08-08 DIAGNOSIS — D539 Nutritional anemia, unspecified: Secondary | ICD-10-CM | POA: Insufficient documentation

## 2019-08-08 LAB — CBC WITH DIFFERENTIAL (CANCER CENTER ONLY)
Abs Immature Granulocytes: 0.01 10*3/uL (ref 0.00–0.07)
Basophils Absolute: 0 10*3/uL (ref 0.0–0.1)
Basophils Relative: 1 %
Eosinophils Absolute: 0.2 10*3/uL (ref 0.0–0.5)
Eosinophils Relative: 5 %
HCT: 37 % — ABNORMAL LOW (ref 39.0–52.0)
Hemoglobin: 12.1 g/dL — ABNORMAL LOW (ref 13.0–17.0)
Immature Granulocytes: 0 %
Lymphocytes Relative: 23 %
Lymphs Abs: 1.1 10*3/uL (ref 0.7–4.0)
MCH: 33.5 pg (ref 26.0–34.0)
MCHC: 32.7 g/dL (ref 30.0–36.0)
MCV: 102.5 fL — ABNORMAL HIGH (ref 80.0–100.0)
Monocytes Absolute: 0.4 10*3/uL (ref 0.1–1.0)
Monocytes Relative: 8 %
Neutro Abs: 2.9 10*3/uL (ref 1.7–7.7)
Neutrophils Relative %: 63 %
Platelet Count: 169 10*3/uL (ref 150–400)
RBC: 3.61 MIL/uL — ABNORMAL LOW (ref 4.22–5.81)
RDW: 11.5 % (ref 11.5–15.5)
WBC Count: 4.6 10*3/uL (ref 4.0–10.5)
nRBC: 0 % (ref 0.0–0.2)

## 2019-08-08 NOTE — Patient Instructions (Signed)
Bone Marrow Aspiration and Bone Marrow Biopsy, Adult, Care After This sheet gives you information about how to care for yourself after your procedure. Your health care provider may also give you more specific instructions. If you have problems or questions, contact your health care provider. What can I expect after the procedure? After the procedure, it is common to have:  Mild pain and tenderness.  Swelling.  Bruising. Follow these instructions at home: Puncture site care      Follow instructions from your health care provider about how to take care of the puncture site. Make sure you: ? Wash your hands with soap and water before you change your bandage (dressing). If soap and water are not available, use hand sanitizer. ? Change your dressing as told by your health care provider.  Check your puncture siteevery day for signs of infection. Check for: ? More redness, swelling, or pain. ? More fluid or blood. ? Warmth. ? Pus or a bad smell. General instructions  Take over-the-counter and prescription medicines only as told by your health care provider.  Do not take baths, swim, or use a hot tub until your health care provider approves. Ask if you can take a shower or have a sponge bath.  Return to your normal activities as told by your health care provider. Ask your health care provider what activities are safe for you.  Do not drive for 24 hours if you were given a medicine to help you relax (sedative) during your procedure.  Keep all follow-up visits as told by your health care provider. This is important. Contact a health care provider if:  Your pain is not controlled with medicine. Get help right away if:  You have a fever.  You have more redness, swelling, or pain around the puncture site.  You have more fluid or blood coming from the puncture site.  Your puncture site feels warm to the touch.  You have pus or a bad smell coming from the puncture site. These  symptoms may represent a serious problem that is an emergency. Do not wait to see if the symptoms will go away. Get medical help right away. Call your local emergency services (911 in the [place] Do not drive yourself to the hospital. Summary  After the procedure, it is common to have mild pain, tenderness, swelling, and bruising.  Follow instructions from your health care provider about how to take care of the puncture site.  Get help right away if you have any symptoms of infection or if you have more blood or fluid coming from the puncture site. This information is not intended to replace advice given to you by your health care provider. Make sure you discuss any questions you have with your health care provider. Document Released: 03/11/2005 Document Revised: 12/05/2017 Document Reviewed: 02/03/2016 Elsevier Patient Education  2020 Elsevier Inc.  Coronavirus (COVID-19) Are you at risk?  Are you at risk for the Coronavirus (COVID-19)?  To be considered HIGH RISK for Coronavirus (COVID-19), you have to meet the following criteria:  . Traveled to [place], [place], [place], [place] or [place]; or in the [place] to [place], [place], [place], or [place]; and have fever, cough, and shortness of breath within the last 2 weeks of travel OR . Been in close contact with a person diagnosed with COVID-19 within the last 2 weeks and have fever, cough, and shortness of breath . IF YOU DO NOT MEET THESE CRITERIA, YOU ARE CONSIDERED LOW RISK   FOR COVID-19.  What to do if you are HIGH RISK for COVID-19?  . If you are having a medical emergency, call 911. . Seek medical care right away. Before you go to a doctor's office, urgent care or emergency department, call ahead and tell them about your recent travel, contact with someone diagnosed with COVID-19, and your symptoms. You should receive instructions from your physician's office regarding next steps of care.  . When you arrive at healthcare  provider, tell the healthcare staff immediately you have returned from visiting [place], [place], [place], [place] or [place]; or traveled in the [place] to [place], [place], [place], or [place]; in the last two weeks or you have been in close contact with a person diagnosed with COVID-19 in the last 2 weeks.   . Tell the health care staff about your symptoms: fever, cough and shortness of breath. . After you have been seen by a medical provider, you will be either: o Tested for (COVID-19) and discharged home on quarantine except to seek medical care if symptoms worsen, and asked to  - Stay home and avoid contact with others until you get your results (4-5 days)  - Avoid travel on public transportation if possible (such as bus, train, or airplane) or o Sent to the Emergency Department by EMS for evaluation, COVID-19 testing, and possible admission depending on your condition and test results.  What to do if you are LOW RISK for COVID-19?  Reduce your risk of any infection by using the same precautions used for avoiding the common cold or flu:  . Wash your hands often with soap and warm water for at least 20 seconds.  If soap and water are not readily available, use an alcohol-based hand sanitizer with at least 60% alcohol.  . If coughing or sneezing, cover your mouth and nose by coughing or sneezing into the elbow areas of your shirt or coat, into a tissue or into your sleeve (not your hands). . Avoid shaking hands with others and consider head nods or verbal greetings only. . Avoid touching your eyes, nose, or mouth with unwashed hands.  . Avoid close contact with people who are sick. . Avoid places or events with large numbers of people in one location, like concerts or sporting events. . Carefully consider travel plans you have or are making. . If you are planning any travel outside or inside the [place], visit the CDC's Travelers' Health webpage for the latest health notices. . If you  have some symptoms but not all symptoms, continue to monitor at home and seek medical attention if your symptoms worsen. . If you are having a medical emergency, call 911.   ADDITIONAL HEALTHCARE OPTIONS FOR PATIENTS  [place] [facility] / e-Visit: [email or web address]         [facility]: [phone]  [facility]: [phone]                   [facility]: [phone]   

## 2019-08-08 NOTE — Progress Notes (Signed)
INDICATION: macrocytic anemia  Brief examination was performed. ENT: adequate airway clearance Heart: regular rate and rhythm.No Murmurs Lungs: clear to auscultation, no wheezes, normal respiratory effort  Bone Marrow Biopsy and Aspiration Procedure Note   Informed consent was obtained and potential risks including bleeding, infection and pain were reviewed with the patient.  The patient's name, date of birth, identification, consent and allergies were verified prior to the start of procedure and time out was performed.  The left posterior iliac crest was chosen as the site of biopsy.  The skin was prepped with ChloraPrep.   14 cc of 2% lidocaine was used to provide local anaesthesia.   10 cc of bone marrow aspirate was obtained followed by 1cm biopsy.  Pressure was applied to the biopsy site and bandage was placed over the biopsy site. Patient was made to lie on the back for 30 mins prior to discharge.  The procedure was tolerated well. COMPLICATIONS: None BLOOD LOSS: none The patient was discharged home in stable condition with a 1 week follow up to review results.  Patient was provided with post bone marrow biopsy instructions and instructed to call if there was any bleeding or worsening pain.  Specimens sent for flow cytometry, cytogenetics and additional studies.  Signed Scot Dock, NP

## 2019-08-08 NOTE — Progress Notes (Signed)
Patient observed for 30 min post BMBX. Vital signs stable and dressing clean/dry/intact. AVS provided and patient knows to call clinic with any questions/concerns. Ambulated out of clinic without incident

## 2019-08-09 ENCOUNTER — Telehealth: Payer: Self-pay | Admitting: Adult Health

## 2019-08-09 LAB — SURGICAL PATHOLOGY

## 2019-08-09 NOTE — Telephone Encounter (Signed)
I left a message regarding video visit  °

## 2019-08-12 ENCOUNTER — Telehealth: Payer: Self-pay | Admitting: Hematology and Oncology

## 2019-08-12 NOTE — Telephone Encounter (Signed)
Patient called to reschedule from 12/11 to 12/14

## 2019-08-13 ENCOUNTER — Telehealth: Payer: Self-pay | Admitting: Hematology and Oncology

## 2019-08-13 NOTE — Telephone Encounter (Signed)
Called Mr. Omahoney to discuss the results of his bone marrow biopsy. There was no answer, I requested a call back.   The pathology report noted there was no clear abnormality causing his macrocytosis. Given this macrocytosis I would have concern that this is a prodrome for a bone marrow abnormality. I would recommend he f/u in our clinic in 3 months for reassessment, and then be followed at least q 6 months after that point to monitor for development of a hematological disorder (possibly early MDS?).  Ledell Peoples, MD Department of Hematology/Oncology [place] at [facility] Phone: [phone] Pager: [phone] Email: [email or web address]

## 2019-08-14 ENCOUNTER — Telehealth: Payer: Self-pay | Admitting: Hematology and Oncology

## 2019-08-14 NOTE — Telephone Encounter (Signed)
Scheduled appt per 12/8 sch message - pt to get an updated schedule next visit

## 2019-08-15 ENCOUNTER — Encounter (HOSPITAL_COMMUNITY): Payer: Self-pay | Admitting: Hematology and Oncology

## 2019-08-16 ENCOUNTER — Telehealth: Payer: Self-pay | Admitting: Hematology and Oncology

## 2019-08-16 ENCOUNTER — Telehealth: Payer: Medicare Other | Admitting: Hematology and Oncology

## 2019-08-16 LAB — HEPATIC FUNCTION PANEL
ALT: 16 IU/L (ref 0–44)
AST: 17 IU/L (ref 0–40)
Albumin: 4.3 g/dL (ref 3.8–4.8)
Alkaline Phosphatase: 75 IU/L (ref 39–117)
Bilirubin Total: 0.9 mg/dL (ref 0.0–1.2)
Bilirubin, Direct: 0.26 mg/dL (ref 0.00–0.40)
Total Protein: 6.5 g/dL (ref 6.0–8.5)

## 2019-08-16 LAB — LIPID PANEL
Chol/HDL Ratio: 2.7 ratio (ref 0.0–5.0)
Cholesterol, Total: 183 mg/dL (ref 100–199)
HDL: 69 mg/dL (ref >39)
LDL Chol Calc (NIH): 103 mg/dL — ABNORMAL HIGH (ref 0–99)
Triglycerides: 59 mg/dL (ref 0–149)
VLDL Cholesterol Cal: 11 mg/dL (ref 5–40)

## 2019-08-16 NOTE — Telephone Encounter (Signed)
Contacted patient to verify telephone visit for pre reg °

## 2019-08-19 ENCOUNTER — Inpatient Hospital Stay (HOSPITAL_BASED_OUTPATIENT_CLINIC_OR_DEPARTMENT_OTHER): Payer: Medicare Other | Admitting: Hematology and Oncology

## 2019-08-19 DIAGNOSIS — D539 Nutritional anemia, unspecified: Secondary | ICD-10-CM

## 2019-08-19 NOTE — Progress Notes (Signed)
[place] [phone]   [phone]  TELEPHONE PROGRESS NOTE  Patient Care Team: Harlan Stains, MD as PCP - General (Family Medicine)  Hematological/Oncological History # Macrocytosis with Mild Anemia 1) 05/31/2019: WBC 4.4, Hgb 13.0, MCV 103.0 (nml 80-94), Plt 179 2) 06/28/2019: WBC 5.5, Hgb 12.2 (nml 13.0-17.0), MCV 101.4 (nml 80-94), Plt 183 3) 07/11/2019: Establish Care with Dr. Lorenso Courier  4) 08/08/2019: Bone Marrow biopsy performed, showed slightly hypercellular bone marrow for age with trilineage hematopoiesis and several small lymphoid aggregates present. Normal cytogenetics were reported.   Interval History:  Casey Mullen 68 y.o. male with medical history significant for macrocytic anemia presents for a follow up telephone visit.  Casey Mullen was last seen in our office on 07/12/2019.   In the interim since our last visit Casey Mullen has undergone a bone marrow biopsy on 08/08/2019.  Unfortunately the results of his bone marrow biopsy do not show any clear etiology for his macrocytic anemia.  On discussion today Casey Mullen notes that he feels well. He reports that he is maintaining his baseline level of exercise and that he has not noticed any issues with fatigue or shortness of breath.  An abridged ROS is listed below.  MEDICAL HISTORY:  Past Medical History:  Diagnosis Date  . Hyperlipidemia     SURGICAL HISTORY: Past Surgical History:  Procedure Laterality Date  . UVULOPALATOPHARYNGOPLASTY      SOCIAL HISTORY: Social History   Socioeconomic History  . Marital status: Married    Spouse name: Not on file  . Number of children: Not on file  . Years of education: Not on file  . Highest education level: Not on file  Occupational History  . Not on file  Tobacco Use  . Smoking status: Never Smoker  . Smokeless tobacco: Never Used  Substance and Sexual Activity  . Alcohol use: Not Currently    Comment: not for 20-30 years  . Drug use:  Not on file  . Sexual activity: Not on file  Other Topics Concern  . Not on file  Social History Narrative  . Not on file   Social Determinants of Health   Financial Resource Strain:   . Difficulty of Paying Living Expenses: Not on file  Food Insecurity:   . Worried About Charity fundraiser in the Last Year: Not on file  . Ran Out of Food in the Last Year: Not on file  Transportation Needs:   . Lack of Transportation (Medical): Not on file  . Lack of Transportation (Non-Medical): Not on file  Physical Activity:   . Days of Exercise per Week: Not on file  . Minutes of Exercise per Session: Not on file  Stress:   . Feeling of Stress : Not on file  Social Connections:   . Frequency of Communication with Friends and Family: Not on file  . Frequency of Social Gatherings with Friends and Family: Not on file  . Attends Religious Services: Not on file  . Active Member of Clubs or Organizations: Not on file  . Attends Archivist Meetings: Not on file  . Marital Status: Not on file  Intimate Partner Violence:   . Fear of Current or Ex-Partner: Not on file  . Emotionally Abused: Not on file  . Physically Abused: Not on file  . Sexually Abused: Not on file    FAMILY HISTORY: Family History  Problem Relation Age of Onset  . Kidney failure Father   .  Hypertension Sister   . Hypertension Brother     ALLERGIES:  is allergic to azithromycin and penicillins.  MEDICATIONS:  No current outpatient medications on file.   No current facility-administered medications for this visit.    REVIEW OF SYSTEMS:   Constitutional: ( - ) fevers, ( - )  chills , ( - ) night sweats Respiratory: ( - ) cough, ( - ) dyspnea, ( - ) wheezes Cardiovascular: ( - ) palpitation, ( - ) chest discomfort, ( - ) lower extremity swelling Gastrointestinal:  ( - ) nausea, ( - ) heartburn, ( - ) change in bowel habits All other systems were reviewed with the patient and are negative.  PHYSICAL  EXAMINATION: No physical exam as this was a telephone visit.   LABORATORY DATA:  I have reviewed the data as listed Lab Results  Component Value Date   WBC 4.6 08/08/2019   HGB 12.1 (L) 08/08/2019   HCT 37.0 (L) 08/08/2019   MCV 102.5 (H) 08/08/2019   PLT 169 08/08/2019   NEUTROABS 2.9 08/08/2019    Pathology:  Surgical Pathology  CASE: [id number]  PATIENT: Casey Mullen  Flow Pathology Report   Clinical history: macrocytosis/anemia   DIAGNOSIS:   - No monoclonal B-cell population or abnormal T-cell phenotype  identified.  - Relative abundance of Natural killer cells (non-specific).    GATING AND PHENOTYPIC ANALYSIS:   Gated population: Flow cytometric immunophenotyping is performed using  antibodies to the antigens listed in the table below. Electronic gates  are placed around a cell cluster displaying light scatter properties  corresponding to: lymphocytes   Abnormal Cells in gated population: N/A   Phenotype of Abnormal Cells: N/A            Lymphoid Antigens    Myeloid Antigens  Miscellaneous  CD2 tested  CD10 tested  CD11b   ND  CD45 tested  CD3 tested  CD19 tested  CD11c   ND  HLA-Dr  ND  CD4 tested  CD20 tested  CD13 ND  CD34 tested  CD5 tested  CD22 ND  CD14 ND  CD38 tested  CD7 tested  CD79b   ND  CD15 ND  CD138   ND  CD8 tested  CD103   ND  CD16 ND  TdT ND  CD25 ND  CD200   tested  CD33 ND  CD123   ND  TCRab   ND  sKappa  tested  CD64 ND  CD41 ND  TCRgd   tested  sLambda  tested  CD117   ND  CD61 ND  CD56 tested  cKappa  ND  MPO ND  CD71 ND  CD57 ND  cLambda  ND    CD235aND   GROSS DESCRIPTION:   Reference Bone Marrow case [id number].   Final Diagnosis performed by Susanne Greenhouse, MD.  Electronically signed  08/09/2019  Technical and / or Professional components performed at [facility], [place] [address], [place], [place]  [address].   ASSESSMENT & PLAN Casey Mullen 69 y.o. male with medical history significant for macrocytic anemia presents for a follow up telephone visit. Casey Mullen was last seen in our office on 07/12/2019.  At that time he had a full nutritional evaluation including vitamin B12, MMA, homocystine, folate, and copper.  All of these revealed no clear cause of his macrocytosis with mild anemia.  Given these findings we performed a bone marrow biopsy on 08/08/2019.  The results of this bone marrow biopsy showed no  clear abnormalities to explain the findings in his peripheral blood.  He does have some small lymphoid aggregates which are unlikely of any clinical significance.  It is likely that what we are seeing at this time is a brewing MDS and this will need to be followed closely for the development of symptoms and more significant anemia and cytopenias.  I would recommend follow-up visit with this patient in June 2021 to further monitor.  #Macrocytosis with Mild Anemia --Bmbx and cytogenetics reveal no clear etiology for his macrocytosis --nutritional evaluation found no clear etiology  --strict return precautions for fatigue, fever, or bleeding.  --RTC in 6 months to continue monitoring his anemia/macrocytosis  All questions were answered. The patient knows to call the clinic with any problems, questions or concerns.  A total of more than 10 minutes were spent on this telephone encounter and over half of that time was spent on counseling and coordination of care as outlined above.   Ledell Peoples, MD Department of Hematology/Oncology [place] at [facility] Phone: [phone] Pager: [phone] Email: [email or web address]  08/19/2019 11:49 AM

## 2019-08-20 ENCOUNTER — Encounter: Payer: Self-pay | Admitting: Cardiovascular Disease

## 2019-08-20 ENCOUNTER — Ambulatory Visit (INDEPENDENT_AMBULATORY_CARE_PROVIDER_SITE_OTHER): Payer: Medicare Other | Admitting: Cardiovascular Disease

## 2019-08-20 ENCOUNTER — Other Ambulatory Visit: Payer: Self-pay

## 2019-08-20 VITALS — BP 134/80 | HR 76 | Temp 97.3°F | Ht 70.0 in | Wt 182.6 lb

## 2019-08-20 DIAGNOSIS — E782 Mixed hyperlipidemia: Secondary | ICD-10-CM | POA: Diagnosis not present

## 2019-08-20 DIAGNOSIS — R931 Abnormal findings on diagnostic imaging of heart and coronary circulation: Secondary | ICD-10-CM | POA: Diagnosis not present

## 2019-08-20 NOTE — Patient Instructions (Signed)
Medication Instructions:  Your physician recommends that you continue on your current medications as directed. Please refer to the Current Medication list given to you today.  If you need a refill on your cardiac medications before your next appointment, please call your pharmacy.   Lab work: NONE  Testing/Procedures: NONE  Follow-Up: At [facility], you and your health needs are our priority.  As part of our continuing mission to provide you with exceptional heart care, we have created designated Provider Care Teams.  These Care Teams include your primary Cardiologist (physician) and Advanced Practice Providers (APPs -  Physician Assistants and Nurse Practitioners) who all work together to provide you with the care you need, when you need it. You may see Dr Berry or one of the following Advanced Practice Providers on your designated Care Team:    Luke Kilroy, PA-C  Callie Goodrich, PA-C  Jesse Cleaver, FNP Your physician wants you to follow-up in: 1 year      

## 2019-08-20 NOTE — Assessment & Plan Note (Signed)
History of hyperlipidemia with recent lipid profile performed 08/16/2019 revealing total cholesterol of 183, LDL of 103 and HDL of 69.  He is not on a statin agent.

## 2019-08-20 NOTE — Assessment & Plan Note (Signed)
History of elevated coronary calcium score of 785 performed 11/02/2018 with coronary calcification in the LAD and RCA.  He is completely asymptomatic and exercises vigorously.

## 2019-08-20 NOTE — Progress Notes (Signed)
08/20/2019 AZAR VECCHIARELLI   [birth year]  [id number]  Primary Physician Harlan Stains, MD Primary Cardiologist: Lorretta Harp MD Lupe Carney, [place]  HPI:  Casey Mullen is a 68 y.o.  thin appearing married Asian male father of 1 son, grandfather 3 grandchildren is retired from working in Press photographer and was referred by Dr. Harlan Stains for cardiovascular valuation because of hyperlipidemia and elevated coronary calcium score.  He has seen Dr. Tollie Eth in the past as well.  I last saw him in the office 02/15/2019 He has no cardiac risk factors other than mild hyperlipidemia with recent lipid profile performed 02/01/2019 revealing total cholesterol 220, LDL 155 and HDL 53.  He does hike daily, play pickle ball and walks 45 miles without symptoms.  He had a recent coronary calcium score performed 11/03/2023 with calcium noted in the RCA and LAD.  Recent lipid profile performed 08/16/2019 revealed a total cholesterol of 183, LDL of 103 and HDL of 59.    No outpatient medications have been marked as taking for the 08/20/19 encounter (Office Visit) with Lorretta Harp, MD.     Allergies  Allergen Reactions  . Azithromycin Rash  . Penicillins Rash    Social History   Socioeconomic History  . Marital status: Married    Spouse name: Not on file  . Number of children: Not on file  . Years of education: Not on file  . Highest education level: Not on file  Occupational History  . Not on file  Tobacco Use  . Smoking status: Never Smoker  . Smokeless tobacco: Never Used  Substance and Sexual Activity  . Alcohol use: Not Currently    Comment: not for 20-30 years  . Drug use: Not on file  . Sexual activity: Not on file  Other Topics Concern  . Not on file  Social History Narrative  . Not on file   Social Determinants of Health   Financial Resource Strain:   . Difficulty of Paying Living Expenses: Not on file  Food Insecurity:   . Worried About Charity fundraiser in  the Last Year: Not on file  . Ran Out of Food in the Last Year: Not on file  Transportation Needs:   . Lack of Transportation (Medical): Not on file  . Lack of Transportation (Non-Medical): Not on file  Physical Activity:   . Days of Exercise per Week: Not on file  . Minutes of Exercise per Session: Not on file  Stress:   . Feeling of Stress : Not on file  Social Connections:   . Frequency of Communication with Friends and Family: Not on file  . Frequency of Social Gatherings with Friends and Family: Not on file  . Attends Religious Services: Not on file  . Active Member of Clubs or Organizations: Not on file  . Attends Archivist Meetings: Not on file  . Marital Status: Not on file  Intimate Partner Violence:   . Fear of Current or Ex-Partner: Not on file  . Emotionally Abused: Not on file  . Physically Abused: Not on file  . Sexually Abused: Not on file     Review of Systems: General: negative for chills, fever, night sweats or weight changes.  Cardiovascular: negative for chest pain, dyspnea on exertion, edema, orthopnea, palpitations, paroxysmal nocturnal dyspnea or shortness of breath Dermatological: negative for rash Respiratory: negative for cough or wheezing Urologic: negative for hematuria Abdominal: negative for nausea, vomiting, diarrhea,  bright red blood per rectum, melena, or hematemesis Neurologic: negative for visual changes, syncope, or dizziness All other systems reviewed and are otherwise negative except as noted above.    Blood pressure 134/80, pulse 76, temperature (!) 97.3 F (36.3 C), height 5\' 10"  (1.778 m), weight 182 lb 9.6 oz (82.8 kg).  General appearance: alert and no distress Neck: no adenopathy, no carotid bruit, no JVD, supple, symmetrical, trachea midline and thyroid not enlarged, symmetric, no tenderness/mass/nodules Lungs: clear to auscultation bilaterally Heart: regular rate and rhythm, S1, S2 normal, no murmur, click, rub or  gallop Extremities: extremities normal, atraumatic, no cyanosis or edema Pulses: 2+ and symmetric Skin: Skin color, texture, turgor normal. No rashes or lesions Neurologic: Alert and oriented X 3, normal strength and tone. Normal symmetric reflexes. Normal coordination and gait  EKG sinus rhythm at 76 with incomplete right bundle branch block.  Personally reviewed this EKG.  ASSESSMENT AND PLAN:   Hyperlipidemia History of hyperlipidemia with recent lipid profile performed 08/16/2019 revealing total cholesterol of 183, LDL of 103 and HDL of 69.  He is not on a statin agent.  Elevated coronary artery calcium score History of elevated coronary calcium score of 785 performed 11/02/2018 with coronary calcification in the LAD and RCA.  He is completely asymptomatic and exercises vigorously.      Lorretta Harp MD FACP,FACC,FAHA, [facility] 08/20/2019 2:24 PM

## 2019-11-12 ENCOUNTER — Other Ambulatory Visit: Payer: Medicare Other

## 2019-11-12 ENCOUNTER — Ambulatory Visit: Payer: Medicare Other | Admitting: Hematology and Oncology

## 2019-11-20 ENCOUNTER — Other Ambulatory Visit: Payer: Self-pay | Admitting: Family Medicine

## 2019-11-20 DIAGNOSIS — I77810 Thoracic aortic ectasia: Secondary | ICD-10-CM

## 2019-12-09 ENCOUNTER — Ambulatory Visit
Admission: RE | Admit: 2019-12-09 | Discharge: 2019-12-09 | Disposition: A | Discharge status: Home / Self Care | Payer: Medicare Other | Source: Ambulatory Visit | Attending: Family Medicine | Admitting: Family Medicine

## 2019-12-09 DIAGNOSIS — I77810 Thoracic aortic ectasia: Secondary | ICD-10-CM

## 2019-12-09 MED ORDER — IOPAMIDOL (ISOVUE-370) INJECTION 76%
75.0000 mL | Freq: Once | INTRAVENOUS | Status: AC | PRN
  Administered 2019-12-09: 75 mL via INTRAVENOUS

## 2019-12-30 ENCOUNTER — Other Ambulatory Visit: Payer: Self-pay | Admitting: Family Medicine

## 2019-12-30 DIAGNOSIS — E041 Nontoxic single thyroid nodule: Secondary | ICD-10-CM

## 2020-01-09 ENCOUNTER — Ambulatory Visit
Admission: RE | Admit: 2020-01-09 | Discharge: 2020-01-09 | Disposition: A | Discharge status: Home / Self Care | Payer: Medicare Other | Source: Ambulatory Visit | Attending: Family Medicine | Admitting: Family Medicine

## 2020-01-09 ENCOUNTER — Other Ambulatory Visit (HOSPITAL_COMMUNITY)
Admission: RE | Admit: 2020-01-09 | Discharge: 2020-01-09 | Disposition: A | Discharge status: Home / Self Care | Payer: Medicare Other | Source: Ambulatory Visit | Attending: Radiology | Admitting: Radiology

## 2020-01-09 DIAGNOSIS — E041 Nontoxic single thyroid nodule: Secondary | ICD-10-CM | POA: Diagnosis present

## 2020-01-10 LAB — CYTOLOGY - NON PAP

## 2020-02-03 ENCOUNTER — Encounter (HOSPITAL_COMMUNITY): Payer: Self-pay

## 2020-02-10 ENCOUNTER — Telehealth: Payer: Self-pay | Admitting: Hematology and Oncology

## 2020-02-10 NOTE — Telephone Encounter (Signed)
R/s appt 6/7 sch message - unable to reach pt .sent message to RN [facility]

## 2020-02-11 ENCOUNTER — Other Ambulatory Visit: Payer: Self-pay | Admitting: Hematology and Oncology

## 2020-02-11 DIAGNOSIS — D539 Nutritional anemia, unspecified: Secondary | ICD-10-CM

## 2020-02-12 ENCOUNTER — Ambulatory Visit: Payer: Medicare Other | Admitting: Hematology and Oncology

## 2020-02-12 ENCOUNTER — Other Ambulatory Visit: Payer: Self-pay

## 2020-02-12 ENCOUNTER — Inpatient Hospital Stay: Payer: Medicare Other | Admitting: Hematology and Oncology

## 2020-02-12 ENCOUNTER — Other Ambulatory Visit: Payer: Medicare Other

## 2020-02-12 ENCOUNTER — Inpatient Hospital Stay: Payer: Medicare Other | Attending: Hematology and Oncology

## 2020-02-12 VITALS — BP 133/86 | HR 90 | Temp 98.6°F | Resp 18 | Ht 70.0 in | Wt 197.6 lb

## 2020-02-12 DIAGNOSIS — D7589 Other specified diseases of blood and blood-forming organs: Secondary | ICD-10-CM | POA: Insufficient documentation

## 2020-02-12 DIAGNOSIS — E785 Hyperlipidemia, unspecified: Secondary | ICD-10-CM | POA: Insufficient documentation

## 2020-02-12 DIAGNOSIS — D539 Nutritional anemia, unspecified: Secondary | ICD-10-CM | POA: Diagnosis not present

## 2020-02-12 LAB — CBC WITH DIFFERENTIAL (CANCER CENTER ONLY)
Abs Immature Granulocytes: 0.02 10*3/uL (ref 0.00–0.07)
Basophils Absolute: 0 10*3/uL (ref 0.0–0.1)
Basophils Relative: 1 %
Eosinophils Absolute: 0.1 10*3/uL (ref 0.0–0.5)
Eosinophils Relative: 2 %
HCT: 41.8 % (ref 39.0–52.0)
Hemoglobin: 13.6 g/dL (ref 13.0–17.0)
Immature Granulocytes: 0 %
Lymphocytes Relative: 15 %
Lymphs Abs: 0.9 10*3/uL (ref 0.7–4.0)
MCH: 33.2 pg (ref 26.0–34.0)
MCHC: 32.5 g/dL (ref 30.0–36.0)
MCV: 102 fL — ABNORMAL HIGH (ref 80.0–100.0)
Monocytes Absolute: 0.4 10*3/uL (ref 0.1–1.0)
Monocytes Relative: 7 %
Neutro Abs: 4.4 10*3/uL (ref 1.7–7.7)
Neutrophils Relative %: 75 %
Platelet Count: 189 10*3/uL (ref 150–400)
RBC: 4.1 MIL/uL — ABNORMAL LOW (ref 4.22–5.81)
RDW: 11.4 % — ABNORMAL LOW (ref 11.5–15.5)
WBC Count: 5.9 10*3/uL (ref 4.0–10.5)
nRBC: 0 % (ref 0.0–0.2)

## 2020-02-12 LAB — CMP (CANCER CENTER ONLY)
ALT: 25 U/L (ref 0–44)
AST: 22 U/L (ref 15–41)
Albumin: 4.3 g/dL (ref 3.5–5.0)
Alkaline Phosphatase: 81 U/L (ref 38–126)
Anion gap: 11 (ref 5–15)
BUN: 24 mg/dL — ABNORMAL HIGH (ref 8–23)
CO2: 22 mmol/L (ref 22–32)
Calcium: 9.7 mg/dL (ref 8.9–10.3)
Chloride: 107 mmol/L (ref 98–111)
Creatinine: 1.34 mg/dL — ABNORMAL HIGH (ref 0.61–1.24)
GFR, Est AFR Am: >60 mL/min (ref >60)
GFR, Estimated: 53 mL/min — ABNORMAL LOW (ref >60)
Glucose, Bld: 117 mg/dL — ABNORMAL HIGH (ref 70–99)
Potassium: 4.1 mmol/L (ref 3.5–5.1)
Sodium: 140 mmol/L (ref 135–145)
Total Bilirubin: 0.9 mg/dL (ref 0.3–1.2)
Total Protein: 7.5 g/dL (ref 6.5–8.1)

## 2020-02-15 ENCOUNTER — Encounter: Payer: Self-pay | Admitting: Hematology and Oncology

## 2020-02-15 NOTE — Progress Notes (Signed)
Casey Mullen [phone]   [phone]  PROGRESS NOTE  Patient Care Team: Harlan Stains, MD as PCP - General (Family Medicine)  Hematological/Oncological History #Macrocytosis with Mild Anemia 1) 05/31/2019: WBC 4.4, Hgb 13.0, MCV 103.0 (nml 80-94), Plt 179 2) 06/28/2019: WBC 5.5, Hgb 12.2 (nml 13.0-17.0), MCV 101.4 (nml 80-94), Plt 183 3) 07/12/2019: Establish Care with Dr. Lorenso Courier. WBC 6.2, Hgb 12.2, MCV 102.0, Plt 163 4) 08/08/2019: Bone Marrow biopsy performed, showed slightly hypercellular bone marrow for age with trilineage hematopoiesis and several small lymphoid aggregates present. Normal cytogenetics were reported.  5) 02/12/2020: WBC 5/9, Hgb 13.6, MCV 102, Plt 189  Interval History:  Casey Mullen 69 y.o. male with medical history significant for macrocytic anemia who presents for a follow up visit. The patient's last visit was on 08/18/2020 with a telephone visit to discuss bone marrow biopsy results. In the interim since the last visit Casey Mullen has had no hospitalizations, ED visits, or medication changes.   On exam today Casey Mullen notes that he feels quite well.  He reports that he has discontinued the keto diet and that he had to do away with it because it was causing his cholesterol levels to rise.  He notes that he had about 15 pounds of weight gain since that time but that he has continued to do low carbohydrates and removed dairy from his diet.  He notes he is not had any trouble with bleeding, bruising, or dark stools.  He remains physically active and denies having any issues with shortness of breath or chest pain.  Additionally he has not had any fevers, chills, sweats, nausea, vomiting or diarrhea.  A full 10 point ROS is listed below.  MEDICAL HISTORY:  Past Medical History:  Diagnosis Date  . Hyperlipidemia     SURGICAL HISTORY: Past Surgical History:  Procedure Laterality Date  . UVULOPALATOPHARYNGOPLASTY      SOCIAL  HISTORY: Social History   Socioeconomic History  . Marital status: Married    Spouse name: Not on file  . Number of children: Not on file  . Years of education: Not on file  . Highest education level: Not on file  Occupational History  . Not on file  Tobacco Use  . Smoking status: Never Smoker  . Smokeless tobacco: Never Used  Vaping Use  . Vaping Use: Never used  Substance and Sexual Activity  . Alcohol use: Not Currently    Comment: not for 20-30 years  . Drug use: Not on file  . Sexual activity: Not on file  Other Topics Concern  . Not on file  Social History Narrative  . Not on file   Social Determinants of Health   Financial Resource Strain:   . Difficulty of Paying Living Expenses:   Food Insecurity:   . Worried About Charity fundraiser in the Last Year:   . Arboriculturist in the Last Year:   Transportation Needs:   . Film/video editor (Medical):   Marland Kitchen Lack of Transportation (Non-Medical):   Physical Activity:   . Days of Exercise per Week:   . Minutes of Exercise per Session:   Stress:   . Feeling of Stress :   Social Connections:   . Frequency of Communication with Friends and Family:   . Frequency of Social Gatherings with Friends and Family:   . Attends Religious Services:   . Active Member of Clubs or Organizations:   . Attends Club or  Organization Meetings:   Marland Kitchen Marital Status:   Intimate Partner Violence:   . Fear of Current or Ex-Partner:   . Emotionally Abused:   Marland Kitchen Physically Abused:   . Sexually Abused:     FAMILY HISTORY: Family History  Problem Relation Age of Onset  . Kidney failure Father   . Hypertension Sister   . Hypertension Brother     ALLERGIES:  is allergic to azithromycin and penicillins.  MEDICATIONS:  No current outpatient medications on file.   No current facility-administered medications for this visit.    REVIEW OF SYSTEMS:   Constitutional: ( - ) fevers, ( - )  chills , ( - ) night sweats Eyes: ( - )  blurriness of vision, ( - ) double vision, ( - ) watery eyes Ears, nose, mouth, throat, and face: ( - ) mucositis, ( - ) sore throat Respiratory: ( - ) cough, ( - ) dyspnea, ( - ) wheezes Cardiovascular: ( - ) palpitation, ( - ) chest discomfort, ( - ) lower extremity swelling Gastrointestinal:  ( - ) nausea, ( - ) heartburn, ( - ) change in bowel habits Skin: ( - ) abnormal skin rashes Lymphatics: ( - ) new lymphadenopathy, ( - ) easy bruising Neurological: ( - ) numbness, ( - ) tingling, ( - ) new weaknesses Behavioral/Psych: ( - ) mood change, ( - ) new changes  All other systems were reviewed with the patient and are negative.  PHYSICAL EXAMINATION: ECOG PERFORMANCE STATUS: 0 - Asymptomatic  Vitals:   02/12/20 1054  BP: 133/86  Pulse: 90  Resp: 18  Temp: 98.6 F (37 C)  SpO2: 97%   Filed Weights   02/12/20 1054  Weight: 197 lb 9.6 oz (89.6 kg)    GENERAL: well appearing elderly male, appears far younger than stated age.  SKIN: skin color, texture, turgor are normal, no rashes or significant lesions EYES: conjunctiva are pink and non-injected, sclera clear LUNGS: clear to auscultation and percussion with normal breathing effort HEART: regular rate & rhythm and no murmurs and no lower extremity edema Musculoskeletal: no cyanosis of digits and no clubbing  PSYCH: alert & oriented x 3, fluent speech NEURO: no focal motor/sensory deficits  LABORATORY DATA:  I have reviewed the data as listed CBC Latest Ref Rng & Units 02/12/2020 08/08/2019 07/12/2019  WBC 4.0 - 10.5 K/uL 5.9 4.6 6.2  Hemoglobin 13.0 - 17.0 g/dL 13.6 12.1(L) 12.2(L)  Hematocrit 39 - 52 % 41.8 37.0(L) 36.5(L)  Platelets 150 - 400 K/uL 189 169 163    CMP Latest Ref Rng & Units 02/12/2020 08/16/2019 07/12/2019  Glucose 70 - 99 mg/dL 117(H) - 103(H)  BUN 8 - 23 mg/dL 24(H) - 28(H)  Creatinine 0.61 - 1.24 mg/dL 1.34(H) - 1.27(H)  Sodium 135 - 145 mmol/L 140 - 140  Potassium 3.5 - 5.1 mmol/L 4.1 - 4.0  Chloride  98 - 111 mmol/L 107 - 106  CO2 22 - 32 mmol/L 22 - 25  Calcium 8.9 - 10.3 mg/dL 9.7 - 9.5  Total Protein 6.5 - 8.1 g/dL 7.5 6.5 6.8  Total Bilirubin 0.3 - 1.2 mg/dL 0.9 0.9 0.9  Alkaline Phos 38 - 126 U/L 81 75 77  AST 15 - 41 U/L _0 ALT 0 - 44 U/L _1 No results found for: MPROTEIN Lab Results  Component Value Date   KPAFRELGTCHN 23.0 (H) 07/12/2019   LAMBDASER 21.6 07/12/2019   KAPLAMBRATIO 1.06 07/12/2019  RADIOGRAPHIC STUDIES: No results found.  ASSESSMENT & PLAN Casey Mullen 70 y.o. male with medical history significant for macrocytic anemia presents for a follow up visit. He has had a full nutritional evaluation including vitamin B12, MMA, homocystine, folate, and copper.  All of these revealed no clear cause of his macrocytosis with mild anemia.  Given these findings we performed a bone marrow biopsy on 08/08/2019.  The results of this bone marrow biopsy showed no clear abnormalities to explain the findings in his peripheral blood.  He does have some small lymphoid aggregates which are unlikely of any clinical significance.  It is likely that what we are seeing at this time is a brewing MDS and this will need to be followed closely for the development of symptoms and more significant anemia and cytopenias.  I would recommend follow-up visit in 12 months time to continue monitoring.   #Macrocytosis with Mild Anemia --Bmbx and cytogenetics reveal no clear etiology for his macrocytosis --nutritional evaluation found no clear etiology  --strict return precautions for fatigue, fever, or bleeding.  --RTC in 12 months to continue monitoring his anemia/macrocytosis  No orders of the defined types were placed in this encounter.   All questions were answered. The patient knows to call the clinic with any problems, questions or concerns.  A total of more than 25 minutes were spent on this encounter and over half of that time was spent on counseling and coordination of  care as outlined above.   Ledell Peoples, MD Department of Hematology/Oncology [place] at [facility] Phone: [phone] Pager: [phone] Email: [email or web address]  02/15/2020 1:45 PM

## 2020-06-26 ENCOUNTER — Telehealth: Payer: Self-pay

## 2020-06-26 NOTE — Telephone Encounter (Signed)
Called patient left message on personal voice mail Dr.Berry advised LDL remains elevated.He wants to see you in office to discuss.Advised to call back on Mon 10/25 to schedule appointment.

## 2020-06-30 NOTE — Telephone Encounter (Signed)
Patient returned call to schedule sooner appointment. However, Dr. Gwenlyn Found does not have availability until 09/24/19 and APPs are not available until 08/2020. Are we able to work patient in? Please advise.

## 2020-06-30 NOTE — Telephone Encounter (Signed)
Spoke with patient. Patient does not want a sooner appointment, he is seeing his PCP in December for his cholesterol. He wants to keep January appointment with Dr. Gwenlyn Found. No further questions.

## 2020-07-03 ENCOUNTER — Telehealth: Payer: Self-pay | Admitting: Cardiovascular Disease

## 2020-07-03 NOTE — Telephone Encounter (Signed)
Patient aware of lab results done by PCP He has appt in jan 2022 with Dr. Gwenlyn Found and visit with PCP soon No further assistance needed

## 2020-07-03 NOTE — Telephone Encounter (Signed)
Patient is returning call to discuss lab results. 

## 2020-09-18 ENCOUNTER — Other Ambulatory Visit: Payer: Self-pay

## 2020-09-18 ENCOUNTER — Ambulatory Visit: Payer: Medicare Other | Admitting: Cardiovascular Disease

## 2020-09-18 ENCOUNTER — Encounter: Payer: Self-pay | Admitting: Cardiovascular Disease

## 2020-09-18 VITALS — BP 128/68 | HR 68 | Ht 70.0 in | Wt 201.0 lb

## 2020-09-18 DIAGNOSIS — E782 Mixed hyperlipidemia: Secondary | ICD-10-CM

## 2020-09-18 DIAGNOSIS — R931 Abnormal findings on diagnostic imaging of heart and coronary circulation: Secondary | ICD-10-CM | POA: Diagnosis not present

## 2020-09-18 NOTE — Progress Notes (Signed)
09/18/2020 Casey Mullen   November 19, 1950  [id number]  Primary Physician Harlan Stains, MD Primary Cardiologist: Lorretta Harp MD Lupe Carney, [place]  HPI:  Casey Mullen is a 70 y.o.  thin appearing married Asian male father of 1 son, grandfather 3 grandchildren is retired from working in [employer] and was referred by Dr. Harlan Stains for cardiovascular valuation because of hyperlipidemia and elevated coronary calcium score. He has seen Dr. Tollie Eth in the past as well.  I last saw him in the office 08/20/2019.  He has no cardiac risk factors other than mild hyperlipidemia with recent lipid profile performed 02/01/2019 revealing total cholesterol 220, LDL 155 and HDL 53. He does hike daily, play pickle ball and walks 45 miles without symptoms. He had a recent coronary calcium score performed 11/03/2023 with calcium noted in the RCA and LAD.  Recent lipid profile performed 08/16/2019 revealed a total cholesterol of 183, LDL of 103 and HDL of 59.  Since I saw him a year ago he continues to do well and be active.  His most recent LDL measured 08/06/2020 was 131.  He still is adamant about not taking a statin drug.  He denies chest pain or shortness of breath.    No outpatient medications have been marked as taking for the 09/18/20 encounter (Office Visit) with Lorretta Harp, MD.     Allergies  Allergen Reactions  . Azithromycin Rash  . Penicillins Rash    Social History   Socioeconomic History  . Marital status: Married    Spouse name: Not on file  . Number of children: Not on file  . Years of education: Not on file  . Highest education level: Not on file  Occupational History  . Not on file  Tobacco Use  . Smoking status: Never Smoker  . Smokeless tobacco: Never Used  Vaping Use  . Vaping Use: Never used  Substance and Sexual Activity  . Alcohol use: Not Currently    Comment: not for 20-30 years  . Drug use: Not on file  . Sexual activity: Not on file  Other  Topics Concern  . Not on file  Social History Narrative  . Not on file   Social Determinants of Health   Financial Resource Strain: Not on file  Food Insecurity: Not on file  Transportation Needs: Not on file  Physical Activity: Not on file  Stress: Not on file  Social Connections: Not on file  Intimate Partner Violence: Not on file     Review of Systems: General: negative for chills, fever, night sweats or weight changes.  Cardiovascular: negative for chest pain, dyspnea on exertion, edema, orthopnea, palpitations, paroxysmal nocturnal dyspnea or shortness of breath Dermatological: negative for rash Respiratory: negative for cough or wheezing Urologic: negative for hematuria Abdominal: negative for nausea, vomiting, diarrhea, bright red blood per rectum, melena, or hematemesis Neurologic: negative for visual changes, syncope, or dizziness All other systems reviewed and are otherwise negative except as noted above.    Blood pressure 128/68, pulse 68, height 5\' 10"  (1.778 m), weight 201 lb (91.2 kg).  General appearance: alert and no distress Neck: no adenopathy, no carotid bruit, no JVD, supple, symmetrical, trachea midline and thyroid not enlarged, symmetric, no tenderness/mass/nodules Lungs: clear to auscultation bilaterally Heart: regular rate and rhythm, S1, S2 normal, no murmur, click, rub or gallop Extremities: extremities normal, atraumatic, no cyanosis or edema Pulses: 2+ and symmetric Skin: Skin color, texture, turgor normal. No rashes  or lesions Neurologic: Alert and oriented X 3, normal strength and tone. Normal symmetric reflexes. Normal coordination and gait  EKG sinus rhythm at 68 with occasional PACs.  I personally reviewed this EKG.  ASSESSMENT AND PLAN:   Hyperlipidemia History of hyperlipidemia not on a statin drug at his request with recent lipid profile performed 08/06/2020 revealing a total cholesterol of 203, LDL of 131 and HDL of 54.  Given his  elevated coronary calcium score of 785 I did suggest that he begin on a statin drug with a goal of obtaining an LDL less than 70 however he still wishes to attempt to treat this with dietary modification.  Elevated coronary artery calcium score Elevated coronary calcium score of 785 with disease primarily in the LAD and RCA.  He is completely asymptomatic and exercises daily playing pickleball hiking and walking.      Lorretta Harp MD FACP,FACC,FAHA, [facility] 09/18/2020 2:21 PM

## 2020-09-18 NOTE — Assessment & Plan Note (Signed)
History of hyperlipidemia not on a statin drug at his request with recent lipid profile performed 08/06/2020 revealing a total cholesterol of 203, LDL of 131 and HDL of 54.  Given his elevated coronary calcium score of 785 I did suggest that he begin on a statin drug with a goal of obtaining an LDL less than 70 however he still wishes to attempt to treat this with dietary modification.

## 2020-09-18 NOTE — Assessment & Plan Note (Signed)
Elevated coronary calcium score of 785 with disease primarily in the LAD and RCA.  He is completely asymptomatic and exercises daily playing pickleball hiking and walking.

## 2020-09-18 NOTE — Patient Instructions (Signed)

## 2020-09-19 IMAGING — US US FNA BIOPSY THYROID 1ST LESION
1 series · 13 of 17 positions shown · non-contrast
Comparison: Thyroid ultrasound performed at outside facility

MEDICATIONS:
None

COMPLICATIONS:
None immediate.

INDICATION: Indeterminate left mid thyroid nodule

EXAM:
ULTRASOUND GUIDED FINE NEEDLE ASPIRATION OF INDETERMINATE LEFT MID
THYROID NODULE
TECHNIQUE: Informed written consent was obtained from the patient after a
discussion of the risks, benefits and alternatives to treatment.
Questions regarding the procedure were encouraged and answered. A
timeout was performed prior to the initiation of the procedure.

[Series 1: us fna biopsy thyroid 1st lesion · 0.07mm/px · 17 acquisitions, 13 frames shown]
[im 1/17]
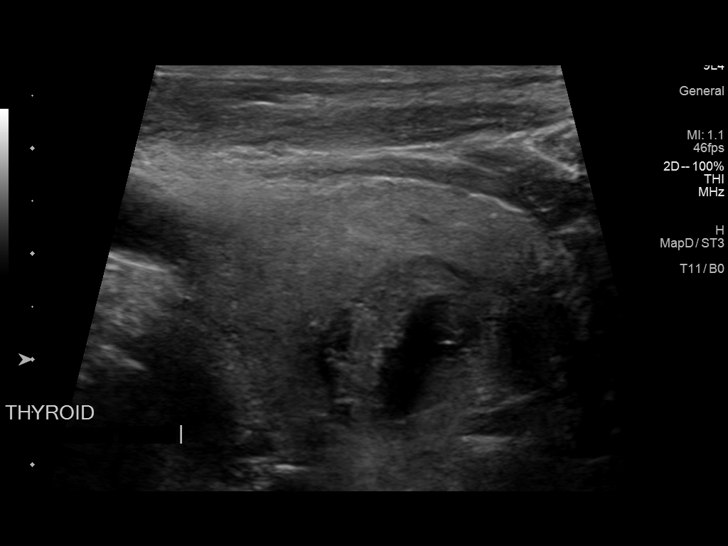
[im 2/17]
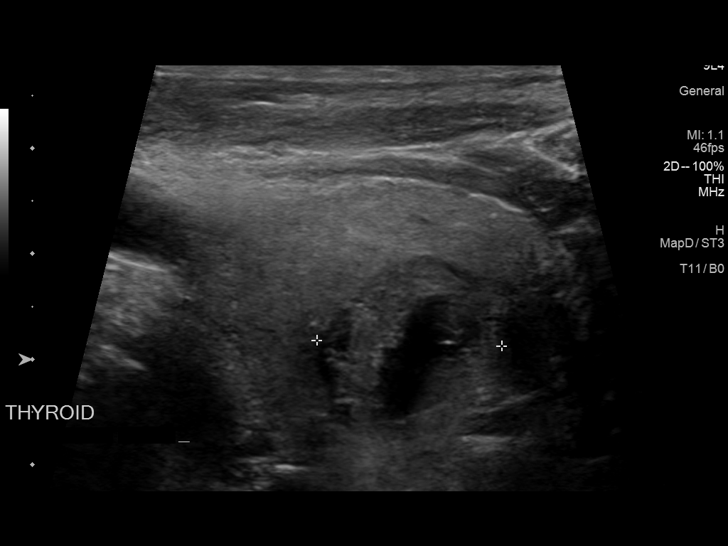
[im 4/17]
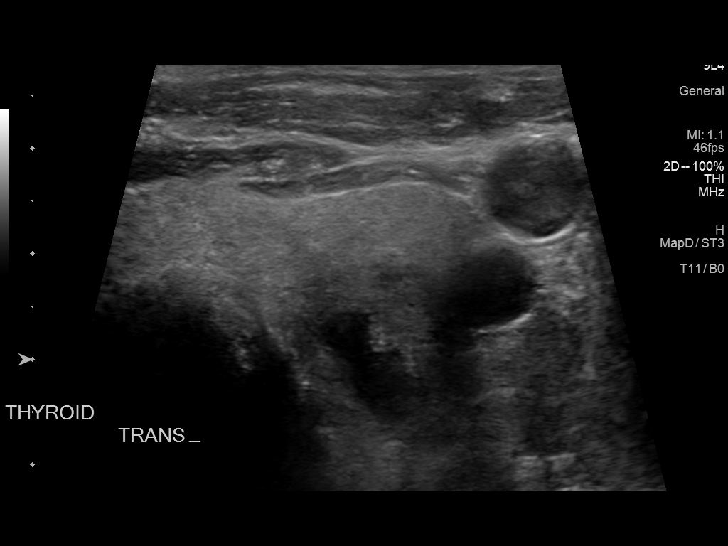
[im 5/17]
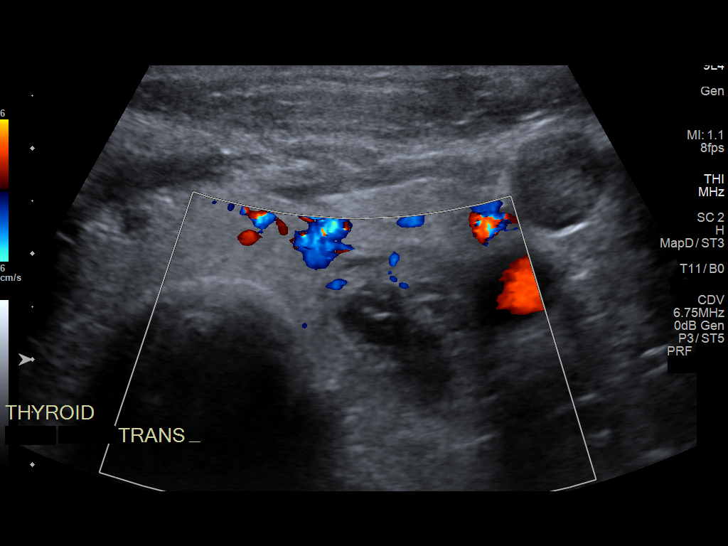
[im 6/17]
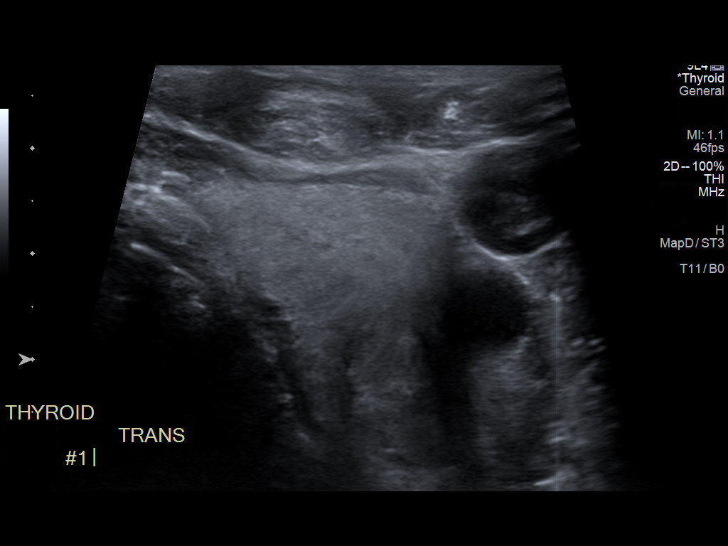
[im 8/17]
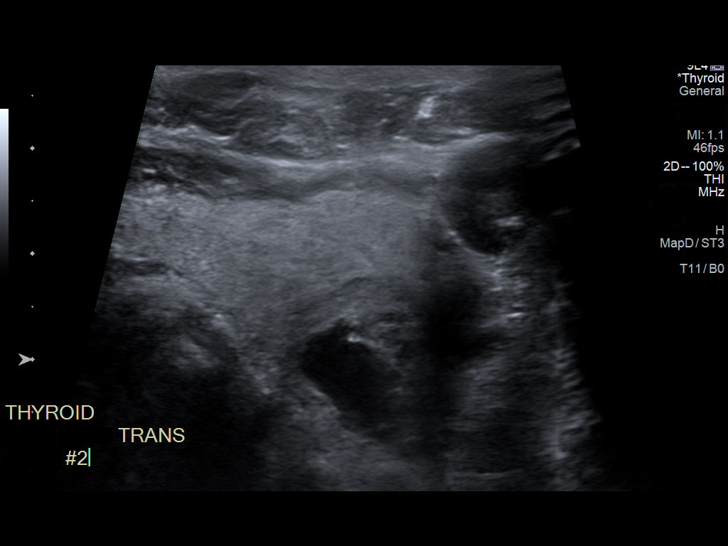
[im 9/17]
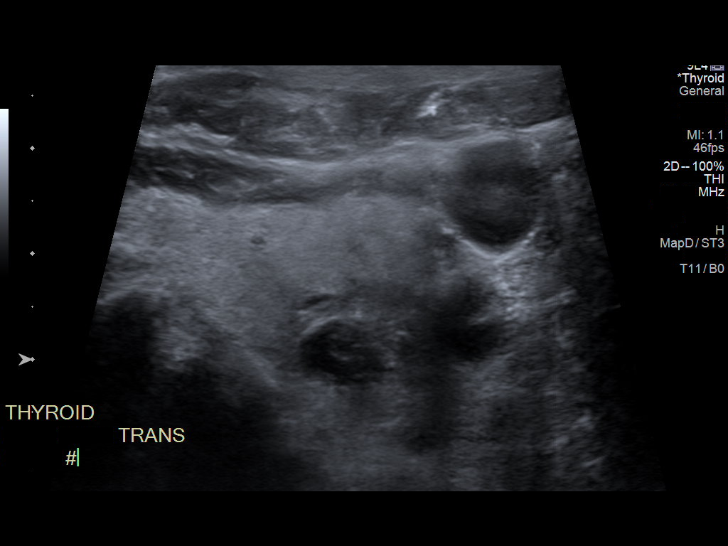
[im 10/17]
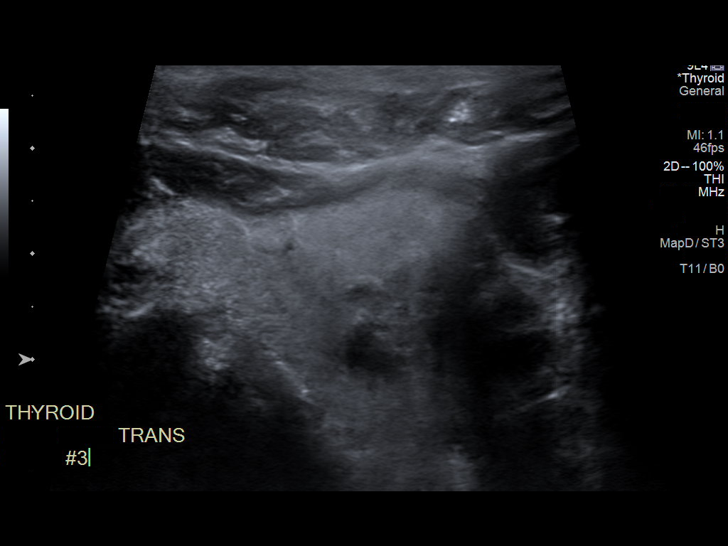
[im 12/17]
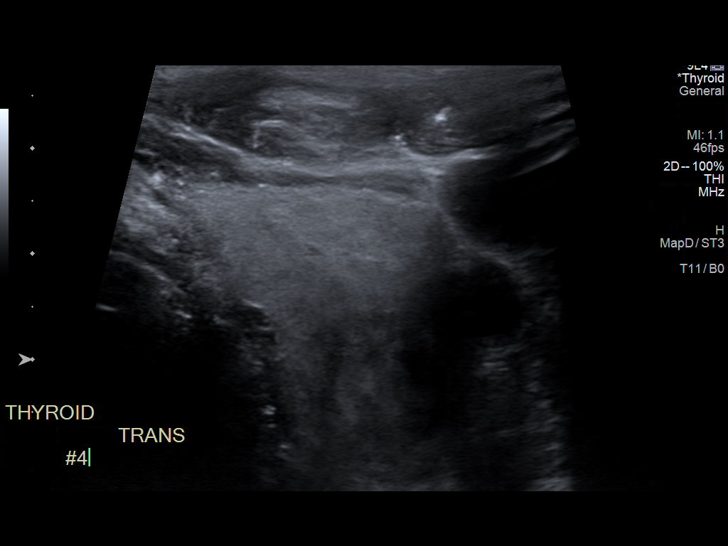
[im 13/17]
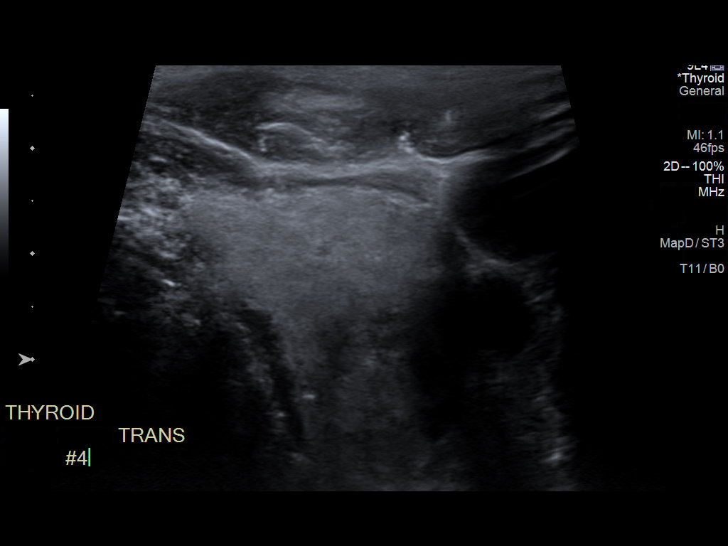
[im 14/17]
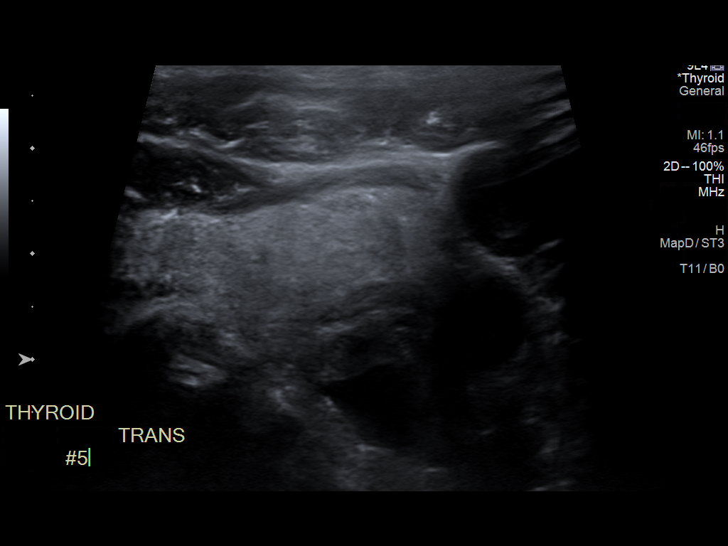
[im 16/17]
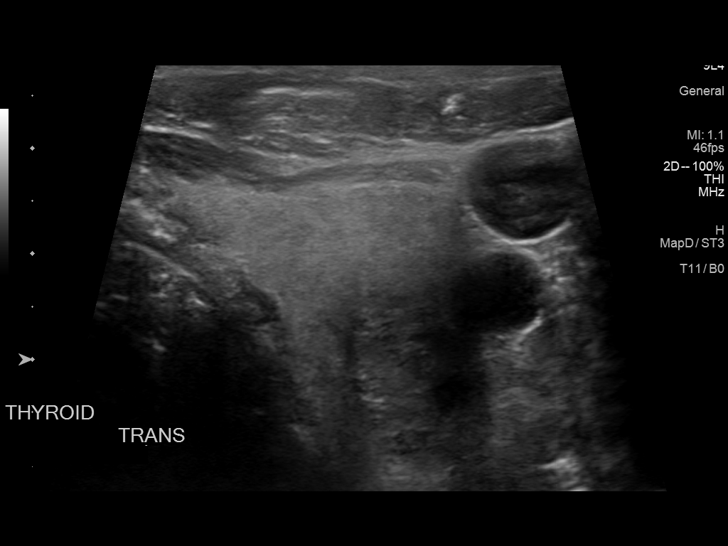
[im 17/17]
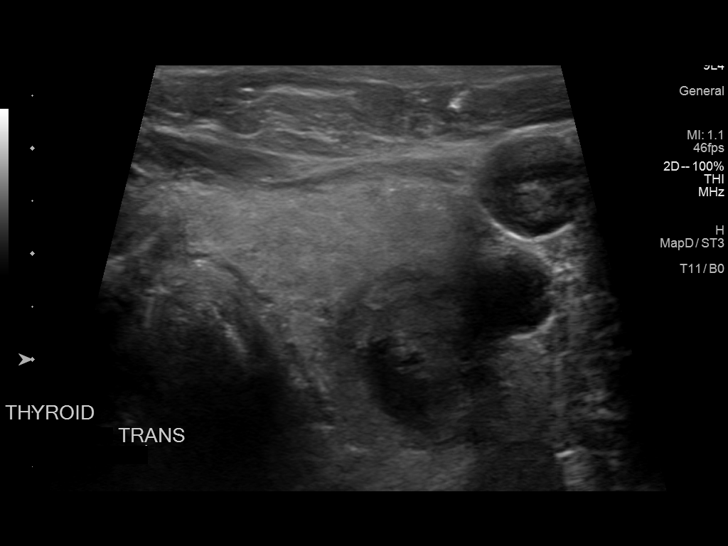

[13 of 17 positions shown; findings below may reference images not displayed]

Pre-procedural ultrasound scanning demonstrated unchanged size and
appearance of the indeterminate nodule within the left thyroid lobe

The procedure was planned. The neck was prepped in the usual sterile
fashion, and a sterile drape was applied covering the operative
field. A timeout was performed prior to the initiation of the
procedure. Local anesthesia was provided with 1% lidocaine.

Under direct ultrasound guidance, 5 FNA biopsies were performed of
the left mid thyroid nodule with a 25 gauge needle. Multiple
ultrasound images were saved for procedural documentation purposes.
The samples were prepared and submitted to pathology.

Limited post procedural scanning was negative for hematoma or
additional complication. Dressings were placed. The patient
tolerated the above procedures procedure well without immediate
postprocedural complication.
FINDINGS: FINDINGS
Nodule reference number based on prior diagnostic ultrasound: 1

Maximum size: 1.6 cm

Location: Left  ;  Mid

ACR TI-RADS total points: 4

ACR TI-RADS risk category:  TR4 (4-6 points)

Prior biopsy:  No

Reason for biopsy: meets ACR TI-RADS criteria

Ultrasound imaging confirms appropriate placement of the needles
within the thyroid nodule.
IMPRESSION: Technically successful ultrasound guided fine needle aspiration of
left mid thyroid nodule as described above.

## 2020-12-23 DIAGNOSIS — E041 Nontoxic single thyroid nodule: Secondary | ICD-10-CM | POA: Diagnosis not present

## 2021-01-13 ENCOUNTER — Telehealth: Payer: Self-pay | Admitting: Hematology and Oncology

## 2021-01-13 NOTE — Telephone Encounter (Signed)
Scheduled 1 year f/u per provider.  Called and spoke with patient. Confirmed appt

## 2021-02-17 ENCOUNTER — Telehealth: Payer: Self-pay | Admitting: Hematology and Oncology

## 2021-02-17 NOTE — Telephone Encounter (Signed)
Moved 6/16 due to provider on call schedule. Called and spoke with patient. Confirmed appt

## 2021-02-18 ENCOUNTER — Inpatient Hospital Stay: Payer: Medicare Other

## 2021-02-18 ENCOUNTER — Inpatient Hospital Stay: Payer: Medicare Other | Admitting: Hematology and Oncology

## 2021-02-19 ENCOUNTER — Inpatient Hospital Stay (HOSPITAL_BASED_OUTPATIENT_CLINIC_OR_DEPARTMENT_OTHER): Payer: Medicare Other | Admitting: Hematology and Oncology

## 2021-02-19 ENCOUNTER — Other Ambulatory Visit: Payer: Self-pay | Admitting: Hematology and Oncology

## 2021-02-19 ENCOUNTER — Inpatient Hospital Stay: Payer: Medicare Other | Attending: Hematology and Oncology

## 2021-02-19 ENCOUNTER — Other Ambulatory Visit: Payer: Self-pay

## 2021-02-19 VITALS — BP 140/71 | HR 86 | Temp 99.6°F | Resp 19 | Ht 70.0 in | Wt 208.1 lb

## 2021-02-19 DIAGNOSIS — D539 Nutritional anemia, unspecified: Secondary | ICD-10-CM | POA: Diagnosis not present

## 2021-02-19 LAB — CBC WITH DIFFERENTIAL (CANCER CENTER ONLY)
Abs Immature Granulocytes: 0.03 10*3/uL (ref 0.00–0.07)
Basophils Absolute: 0 10*3/uL (ref 0.0–0.1)
Basophils Relative: 1 %
Eosinophils Absolute: 0.2 10*3/uL (ref 0.0–0.5)
Eosinophils Relative: 3 %
HCT: 38.6 % — ABNORMAL LOW (ref 39.0–52.0)
Hemoglobin: 13.5 g/dL (ref 13.0–17.0)
Immature Granulocytes: 1 %
Lymphocytes Relative: 24 %
Lymphs Abs: 1.6 10*3/uL (ref 0.7–4.0)
MCH: 34.9 pg — ABNORMAL HIGH (ref 26.0–34.0)
MCHC: 35 g/dL (ref 30.0–36.0)
MCV: 99.7 fL (ref 80.0–100.0)
Monocytes Absolute: 0.4 10*3/uL (ref 0.1–1.0)
Monocytes Relative: 6 %
Neutro Abs: 4.3 10*3/uL (ref 1.7–7.7)
Neutrophils Relative %: 65 %
Platelet Count: 188 10*3/uL (ref 150–400)
RBC: 3.87 MIL/uL — ABNORMAL LOW (ref 4.22–5.81)
RDW: 11.5 % (ref 11.5–15.5)
WBC Count: 6.5 10*3/uL (ref 4.0–10.5)
nRBC: 0 % (ref 0.0–0.2)

## 2021-02-19 LAB — CMP (CANCER CENTER ONLY)
ALT: 29 U/L (ref 0–44)
AST: 26 U/L (ref 15–41)
Albumin: 4.4 g/dL (ref 3.5–5.0)
Alkaline Phosphatase: 86 U/L (ref 38–126)
Anion gap: 8 (ref 5–15)
BUN: 34 mg/dL — ABNORMAL HIGH (ref 8–23)
CO2: 24 mmol/L (ref 22–32)
Calcium: 9.7 mg/dL (ref 8.9–10.3)
Chloride: 108 mmol/L (ref 98–111)
Creatinine: 1.6 mg/dL — ABNORMAL HIGH (ref 0.61–1.24)
GFR, Estimated: 46 mL/min — ABNORMAL LOW (ref >60)
Glucose, Bld: 117 mg/dL — ABNORMAL HIGH (ref 70–99)
Potassium: 4.1 mmol/L (ref 3.5–5.1)
Sodium: 140 mmol/L (ref 135–145)
Total Bilirubin: 1 mg/dL (ref 0.3–1.2)
Total Protein: 6.8 g/dL (ref 6.5–8.1)

## 2021-02-19 NOTE — Progress Notes (Signed)
[facility] [phone]   [phone]  PROGRESS NOTE  Patient Care Team: White, Cynthia, MD as PCP - General (Family Medicine)  Hematological/Oncological History # Macrocytosis with Mild Anemia 1) 05/31/2019: WBC 4.4, Hgb 13.0, MCV 103.0 (nml 80-94), Plt 179 2) 06/28/2019: WBC 5.5, Hgb 12.2 (nml 13.0-17.0), MCV 101.4 (nml 80-94), Plt 183 3) 07/12/2019: Establish Care with Dr. Dorsey. WBC 6.2, Hgb 12.2, MCV 102.0, Plt 163  4) 08/08/2019: Bone Marrow biopsy performed, showed slightly hypercellular bone marrow for age with trilineage hematopoiesis and several small lymphoid aggregates present. Normal cytogenetics were reported.  5) 02/12/2020: WBC 5/9, Hgb 13.6, MCV 102, Plt 189 6) 02/19/2021: WBC 6.5, Hgb 13.5, MCV 99.7, Plt 188  Interval History:  Aren R Yousuf 70 y.o. male with medical history significant for macrocytic anemia who presents for a follow up visit. The patient's last visit was on 08/18/2020. In the interim since the last visit Mr. Nelles has had no hospitalizations, ED visits, or medication changes.   On exam today Mr. Mccandlish notes he has been well in the interim since her last visit.  He reports that the last time we saw him he was on a keto diet and now he is discontinued that.  He notes that he was drinking a lot of dairy products and now he is "cut it out".  He is currently on a normal diet.  He currently weighs 208 pounds up from 177 at his lowest on keto.  He notes that he exercises just as much and his energy is quite good.  He is also had no changes in his alcohol consumption as he drinks none.  He reports he has more energy now than before.  Additionally he has not had any fevers, chills, sweats, nausea, vomiting or diarrhea.  A full 10 point ROS is listed below.  MEDICAL HISTORY:  Past Medical History:  Diagnosis Date   Hyperlipidemia     SURGICAL HISTORY: Past Surgical History:  Procedure Laterality Date   UVULOPALATOPHARYNGOPLASTY       SOCIAL HISTORY: Social History   Socioeconomic History   Marital status: Married    Spouse name: Not on file   Number of children: Not on file   Years of education: Not on file   Highest education level: Not on file  Occupational History   Not on file  Tobacco Use   Smoking status: Never   Smokeless tobacco: Never  Vaping Use   Vaping Use: Never used  Substance and Sexual Activity   Alcohol use: Not Currently    Comment: not for 20-30 years   Drug use: Not on file   Sexual activity: Not on file  Other Topics Concern   Not on file  Social History Narrative   Not on file   Social Determinants of Health   Financial Resource Strain: Not on file  Food Insecurity: Not on file  Transportation Needs: Not on file  Physical Activity: Not on file  Stress: Not on file  Social Connections: Not on file  Intimate Partner Violence: Not on file    FAMILY HISTORY: Family History  Problem Relation Age of Onset   Kidney failure Father    Hypertension Sister    Hypertension Brother     ALLERGIES:  is allergic to azithromycin and penicillins.  MEDICATIONS:  No current outpatient medications on file.   No current facility-administered medications for this visit.    REVIEW OF SYSTEMS:   Constitutional: ( - ) fevers, ( - )    chills , ( - ) night sweats Eyes: ( - ) blurriness of vision, ( - ) double vision, ( - ) watery eyes Ears, nose, mouth, throat, and face: ( - ) mucositis, ( - ) sore throat Respiratory: ( - ) cough, ( - ) dyspnea, ( - ) wheezes Cardiovascular: ( - ) palpitation, ( - ) chest discomfort, ( - ) lower extremity swelling Gastrointestinal:  ( - ) nausea, ( - ) heartburn, ( - ) change in bowel habits Skin: ( - ) abnormal skin rashes Lymphatics: ( - ) new lymphadenopathy, ( - ) easy bruising Neurological: ( - ) numbness, ( - ) tingling, ( - ) new weaknesses Behavioral/Psych: ( - ) mood change, ( - ) new changes  All other systems were reviewed with the  patient and are negative.  PHYSICAL EXAMINATION: ECOG PERFORMANCE STATUS: 0 - Asymptomatic  Vitals:   02/19/21 1511  BP: 140/71  Pulse: 86  Resp: 19  Temp: 99.6 F (37.6 C)  SpO2: 94%   Filed Weights   02/19/21 1511  Weight: 208 lb 1.6 oz (94.4 kg)    GENERAL: well appearing elderly male, appears far younger than stated age.  SKIN: skin color, texture, turgor are normal, no rashes or significant lesions EYES: conjunctiva are pink and non-injected, sclera clear LUNGS: clear to auscultation and percussion with normal breathing effort HEART: regular rate & rhythm and no murmurs and no lower extremity edema Musculoskeletal: no cyanosis of digits and no clubbing  PSYCH: alert & oriented x 3, fluent speech NEURO: no focal motor/sensory deficits  LABORATORY DATA:  I have reviewed the data as listed CBC Latest Ref Rng & Units 02/19/2021 02/12/2020 08/08/2019  WBC 4.0 - 10.5 K/uL 6.5 5.9 4.6  Hemoglobin 13.0 - 17.0 g/dL 13.5 13.6 12.1(L)  Hematocrit 39.0 - 52.0 % 38.6(L) 41.8 37.0(L)  Platelets 150 - 400 K/uL 188 189 169    CMP Latest Ref Rng & Units 02/19/2021 02/12/2020 08/16/2019  Glucose 70 - 99 mg/dL 117(H) 117(H) -  BUN 8 - 23 mg/dL 34(H) 24(H) -  Creatinine 0.61 - 1.24 mg/dL 1.60(H) 1.34(H) -  Sodium 135 - 145 mmol/L 140 140 -  Potassium 3.5 - 5.1 mmol/L 4.1 4.1 -  Chloride 98 - 111 mmol/L 108 107 -  CO2 22 - 32 mmol/L 24 22 -  Calcium 8.9 - 10.3 mg/dL 9.7 9.7 -  Total Protein 6.5 - 8.1 g/dL 6.8 7.5 6.5  Total Bilirubin 0.3 - 1.2 mg/dL 1.0 0.9 0.9  Alkaline Phos 38 - 126 U/L 86 81 75  AST 15 - 41 U/L 26 22 17  ALT 0 - 44 U/L 29 25 16    No results found for: MPROTEIN Lab Results  Component Value Date   KPAFRELGTCHN 23.0 (H) 07/12/2019   LAMBDASER 21.6 07/12/2019   KAPLAMBRATIO 1.06 07/12/2019    RADIOGRAPHIC STUDIES: No results found.  ASSESSMENT & PLAN Reily R Crossett 70 y.o. male with medical history significant for macrocytic anemia presents for a follow up  visit. He has had a full nutritional evaluation including vitamin B12, MMA, homocystine, folate, and copper.  All of these revealed no clear cause of his macrocytosis with mild anemia.  Given these findings we performed a bone marrow biopsy on 08/08/2019.  The results of this bone marrow biopsy showed no clear abnormalities to explain the findings in his peripheral blood.  He does have some small lymphoid aggregates which are unlikely of any clinical significance.  It is likely   that what we are seeing at this time is a brewing MDS and this will need to be followed closely for the development of symptoms and more significant anemia and cytopenias.  I would recommend follow-up visit in 12 months time to continue monitoring.   #Macrocytosis, resolved #Anemia, Resolved --Bmbx and cytogenetics reveal no clear etiology for his macrocytosis --nutritional evaluation found no clear etiology  --strict return precautions for fatigue, fever, or bleeding.  --Hgb at 13.5 and MCV at 99.7 today. First normal MCV on record since 2020.  --RTC in 12 months to continue monitoring his anemia/macrocytosis. If CBC remains normal at next visit can d/c from clinic.   No orders of the defined types were placed in this encounter.  All questions were answered. The patient knows to call the clinic with any problems, questions or concerns.  A total of more than 25 minutes were spent on this encounter and over half of that time was spent on counseling and coordination of care as outlined above.   Ledell Peoples, MD Department of Hematology/Oncology [place] at [facility] Phone: [phone] Pager: [phone] Email: [email or web address]  02/19/2021 4:17 PM

## 2021-02-23 ENCOUNTER — Telehealth: Payer: Self-pay | Admitting: Hematology and Oncology

## 2021-02-23 NOTE — Telephone Encounter (Signed)
Scheduled per los. Mailed printout  °

## 2021-04-08 DIAGNOSIS — Z20822 Contact with and (suspected) exposure to covid-19: Secondary | ICD-10-CM | POA: Diagnosis not present

## 2021-05-04 DIAGNOSIS — D7589 Other specified diseases of blood and blood-forming organs: Secondary | ICD-10-CM | POA: Diagnosis not present

## 2021-05-04 DIAGNOSIS — E041 Nontoxic single thyroid nodule: Secondary | ICD-10-CM | POA: Diagnosis not present

## 2021-05-04 DIAGNOSIS — Z86010 Personal history of colonic polyps: Secondary | ICD-10-CM | POA: Diagnosis not present

## 2021-05-04 DIAGNOSIS — Z0000 Encounter for general adult medical examination without abnormal findings: Secondary | ICD-10-CM | POA: Diagnosis not present

## 2021-05-04 DIAGNOSIS — E785 Hyperlipidemia, unspecified: Secondary | ICD-10-CM | POA: Diagnosis not present

## 2021-05-04 DIAGNOSIS — Z23 Encounter for immunization: Secondary | ICD-10-CM | POA: Diagnosis not present

## 2021-05-05 DIAGNOSIS — Z86010 Personal history of colonic polyps: Secondary | ICD-10-CM | POA: Diagnosis not present

## 2021-05-05 DIAGNOSIS — Z7689 Persons encountering health services in other specified circumstances: Secondary | ICD-10-CM | POA: Diagnosis not present

## 2021-12-13 ENCOUNTER — Other Ambulatory Visit: Payer: Self-pay | Admitting: Family Medicine

## 2021-12-13 DIAGNOSIS — I77810 Thoracic aortic ectasia: Secondary | ICD-10-CM

## 2022-01-05 ENCOUNTER — Other Ambulatory Visit: Payer: Self-pay | Admitting: Family Medicine

## 2022-01-05 DIAGNOSIS — E041 Nontoxic single thyroid nodule: Secondary | ICD-10-CM

## 2022-01-21 ENCOUNTER — Ambulatory Visit
Admission: RE | Admit: 2022-01-21 | Discharge: 2022-01-21 | Disposition: A | Discharge status: Home / Self Care | Payer: Medicare Other | Source: Ambulatory Visit | Attending: Family Medicine | Admitting: Family Medicine

## 2022-01-21 DIAGNOSIS — E041 Nontoxic single thyroid nodule: Secondary | ICD-10-CM | POA: Diagnosis not present

## 2022-02-21 ENCOUNTER — Other Ambulatory Visit: Payer: Self-pay

## 2022-02-21 ENCOUNTER — Other Ambulatory Visit: Payer: Self-pay | Admitting: Hematology and Oncology

## 2022-02-21 ENCOUNTER — Inpatient Hospital Stay: Payer: Medicare Other

## 2022-02-21 ENCOUNTER — Inpatient Hospital Stay: Payer: Medicare Other | Attending: Hematology and Oncology | Admitting: Hematology and Oncology

## 2022-02-21 VITALS — BP 128/65 | HR 87 | Temp 97.8°F | Resp 17 | Ht 70.0 in | Wt 212.5 lb

## 2022-02-21 DIAGNOSIS — D649 Anemia, unspecified: Secondary | ICD-10-CM | POA: Diagnosis present

## 2022-02-21 DIAGNOSIS — D539 Nutritional anemia, unspecified: Secondary | ICD-10-CM | POA: Diagnosis not present

## 2022-02-21 DIAGNOSIS — R718 Other abnormality of red blood cells: Secondary | ICD-10-CM | POA: Diagnosis not present

## 2022-02-21 DIAGNOSIS — D7589 Other specified diseases of blood and blood-forming organs: Secondary | ICD-10-CM | POA: Diagnosis present

## 2022-02-21 LAB — CMP (CANCER CENTER ONLY)
ALT: 19 U/L (ref 0–44)
AST: 21 U/L (ref 15–41)
Albumin: 4.1 g/dL (ref 3.5–5.0)
Alkaline Phosphatase: 81 U/L (ref 38–126)
Anion gap: 7 (ref 5–15)
BUN: 22 mg/dL (ref 8–23)
CO2: 24 mmol/L (ref 22–32)
Calcium: 9.6 mg/dL (ref 8.9–10.3)
Chloride: 107 mmol/L (ref 98–111)
Creatinine: 1.17 mg/dL (ref 0.61–1.24)
GFR, Estimated: >60 mL/min (ref >60)
Glucose, Bld: 128 mg/dL — ABNORMAL HIGH (ref 70–99)
Potassium: 4.3 mmol/L (ref 3.5–5.1)
Sodium: 138 mmol/L (ref 135–145)
Total Bilirubin: 1 mg/dL (ref 0.3–1.2)
Total Protein: 6.6 g/dL (ref 6.5–8.1)

## 2022-02-21 LAB — CBC WITH DIFFERENTIAL (CANCER CENTER ONLY)
Abs Immature Granulocytes: 0.02 10*3/uL (ref 0.00–0.07)
Basophils Absolute: 0 10*3/uL (ref 0.0–0.1)
Basophils Relative: 1 %
Eosinophils Absolute: 0.1 10*3/uL (ref 0.0–0.5)
Eosinophils Relative: 2 %
HCT: 39.3 % (ref 39.0–52.0)
Hemoglobin: 13.3 g/dL (ref 13.0–17.0)
Immature Granulocytes: 0 %
Lymphocytes Relative: 21 %
Lymphs Abs: 1.6 10*3/uL (ref 0.7–4.0)
MCH: 34.2 pg — ABNORMAL HIGH (ref 26.0–34.0)
MCHC: 33.8 g/dL (ref 30.0–36.0)
MCV: 101 fL — ABNORMAL HIGH (ref 80.0–100.0)
Monocytes Absolute: 0.4 10*3/uL (ref 0.1–1.0)
Monocytes Relative: 5 %
Neutro Abs: 5.4 10*3/uL (ref 1.7–7.7)
Neutrophils Relative %: 71 %
Platelet Count: 176 10*3/uL (ref 150–400)
RBC: 3.89 MIL/uL — ABNORMAL LOW (ref 4.22–5.81)
RDW: 11.3 % — ABNORMAL LOW (ref 11.5–15.5)
WBC Count: 7.5 10*3/uL (ref 4.0–10.5)
nRBC: 0 % (ref 0.0–0.2)

## 2022-02-21 NOTE — Progress Notes (Signed)
[place] [phone]   [phone]  PROGRESS NOTE  Patient Care Team: Harlan Stains, MD as PCP - General (Family Medicine)  Hematological/Oncological History # Macrocytosis with Mild Anemia 1) 05/31/2019: WBC 4.4, Hgb 13.0, MCV 103.0 (nml 80-94), Plt 179 2) 06/28/2019: WBC 5.5, Hgb 12.2 (nml 13.0-17.0), MCV 101.4 (nml 80-94), Plt 183 3) 07/12/2019: Establish Care with Dr. Lorenso Courier. WBC 6.2, Hgb 12.2, MCV 102.0, Plt 163  4) 08/08/2019: Bone Marrow biopsy performed, showed slightly hypercellular bone marrow for age with trilineage hematopoiesis and several small lymphoid aggregates present. Normal cytogenetics were reported.  5) 02/12/2020: WBC 5/9, Hgb 13.6, MCV 102, Plt 189 6) 02/19/2021: WBC 6.5, Hgb 13.5, MCV 99.7, Plt 188 7) 02/21/2022: WBC 7.5, Hgb 13.3, MCV 101, Plt 176  Interval History:  Casey Mullen 71 y.o. male with medical history significant for macrocytic anemia who presents for a follow up visit. The patient's last visit was on 02/19/2021. In the interim since the last visit [name] has had no hospitalizations, ED visits, or medication changes.   On exam today [name] notes he has been well overall interim since her last visit.  He reports that he is not taking any new medications and has been otherwise healthy.  Has had no changes in his diet or lifestyle.  He continues not to drink alcohol.  He notes his energy is quite good and ranks it as a 10 out of 10.  He plays pickle ball approximately 6 days/week for 4 hours/day.  He notes that he is very active and feels no limitations in his day-to-day life.  Additionally he has not had any fevers, chills, sweats, nausea, vomiting or diarrhea.  A full 10 point ROS is listed below.  MEDICAL HISTORY:  Past Medical History:  Diagnosis Date   Hyperlipidemia     SURGICAL HISTORY: Past Surgical History:  Procedure Laterality Date   UVULOPALATOPHARYNGOPLASTY      SOCIAL HISTORY: Social History    Socioeconomic History   Marital status: Married    Spouse name: Not on file   Number of children: Not on file   Years of education: Not on file   Highest education level: Not on file  Occupational History   Not on file  Tobacco Use   Smoking status: Never   Smokeless tobacco: Never  Vaping Use   Vaping Use: Never used  Substance and Sexual Activity   Alcohol use: Not Currently    Comment: not for 20-30 years   Drug use: Not on file   Sexual activity: Not on file  Other Topics Concern   Not on file  Social History Narrative   Not on file   Social Determinants of Health   Financial Resource Strain: Not on file  Food Insecurity: Not on file  Transportation Needs: Not on file  Physical Activity: Not on file  Stress: Not on file  Social Connections: Not on file  Intimate Partner Violence: Not on file    FAMILY HISTORY: Family History  Problem Relation Age of Onset   Kidney failure Father    Hypertension Sister    Hypertension Brother     ALLERGIES:  is allergic to azithromycin and penicillins.  MEDICATIONS:  No current outpatient medications on file.   No current facility-administered medications for this visit.    REVIEW OF SYSTEMS:   Constitutional: ( - ) fevers, ( - )  chills , ( - ) night sweats Eyes: ( - ) blurriness of vision, ( - )  double vision, ( - ) watery eyes Ears, nose, mouth, throat, and face: ( - ) mucositis, ( - ) sore throat Respiratory: ( - ) cough, ( - ) dyspnea, ( - ) wheezes Cardiovascular: ( - ) palpitation, ( - ) chest discomfort, ( - ) lower extremity swelling Gastrointestinal:  ( - ) nausea, ( - ) heartburn, ( - ) change in bowel habits Skin: ( - ) abnormal skin rashes Lymphatics: ( - ) new lymphadenopathy, ( - ) easy bruising Neurological: ( - ) numbness, ( - ) tingling, ( - ) new weaknesses Behavioral/Psych: ( - ) mood change, ( - ) new changes  All other systems were reviewed with the patient and are negative.  PHYSICAL  EXAMINATION: ECOG PERFORMANCE STATUS: 0 - Asymptomatic  Vitals:   02/21/22 1457  BP: 128/65  Pulse: 87  Resp: 17  Temp: 97.8 F (36.6 C)  SpO2: 98%   Filed Weights   02/21/22 1457  Weight: 212 lb 8 oz (96.4 kg)    GENERAL: well appearing elderly male, appears far younger than stated age.  SKIN: skin color, texture, turgor are normal, no rashes or significant lesions EYES: conjunctiva are pink and non-injected, sclera clear LUNGS: clear to auscultation and percussion with normal breathing effort HEART: regular rate & rhythm and no murmurs and no lower extremity edema Musculoskeletal: no cyanosis of digits and no clubbing  PSYCH: alert & oriented x 3, fluent speech NEURO: no focal motor/sensory deficits  LABORATORY DATA:  I have reviewed the data as listed    Latest Ref Rng & Units 02/21/2022    2:34 PM 02/19/2021    3:00 PM 02/12/2020   10:32 AM  CBC  WBC 4.0 - 10.5 K/uL 7.5  6.5  5.9   Hemoglobin 13.0 - 17.0 g/dL 13.3  13.5  13.6   Hematocrit 39.0 - 52.0 % 39.3  38.6  41.8   Platelets 150 - 400 K/uL 176  188  189        Latest Ref Rng & Units 02/21/2022    2:34 PM 02/19/2021    3:00 PM 02/12/2020   10:32 AM  CMP  Glucose 70 - 99 mg/dL 128  117  117   BUN 8 - 23 mg/dL 22  34  24   Creatinine 0.61 - 1.24 mg/dL 1.17  1.60  1.34   Sodium 135 - 145 mmol/L 138  140  140   Potassium 3.5 - 5.1 mmol/L 4.3  4.1  4.1   Chloride 98 - 111 mmol/L 107  108  107   CO2 22 - 32 mmol/L _0 Calcium 8.9 - 10.3 mg/dL 9.6  9.7  9.7   Total Protein 6.5 - 8.1 g/dL 6.6  6.8  7.5   Total Bilirubin 0.3 - 1.2 mg/dL 1.0  1.0  0.9   Alkaline Phos 38 - 126 U/L 81  86  81   AST 15 - 41 U/L _1 ALT 0 - 44 U/L _2 No results found for: "MPROTEIN" Lab Results  Component Value Date   KPAFRELGTCHN 23.0 (H) 07/12/2019   LAMBDASER 21.6 07/12/2019   KAPLAMBRATIO 1.06 07/12/2019    RADIOGRAPHIC STUDIES: No results found.  ASSESSMENT & PLAN CHOZEN LATULIPPE 71 y.o.  male with medical history significant for macrocytic anemia presents for a follow up visit. He has had a full nutritional evaluation including vitamin B12,  MMA, homocystine, folate, and copper.  All of these revealed no clear cause of his macrocytosis with mild anemia.  Given these findings we performed a bone marrow biopsy on 08/08/2019.  The results of this bone marrow biopsy showed no clear abnormalities to explain the findings in his peripheral blood.  He does have some small lymphoid aggregates which are unlikely of any clinical significance.  It is likely that what we are seeing at this time is a brewing MDS and this will need to be followed with at least yearly CBCs for the development of symptoms and more significant anemia and cytopenias.  I would recommend follow-up visit on an as-needed basis.  #Macrocytosis, resolved #Anemia, Resolved --Bmbx and cytogenetics reveal no clear etiology for his macrocytosis --nutritional evaluation found no clear etiology  --strict return precautions for fatigue, fever, or bleeding.  --Hgb 13.3, white blood cell count 7.5, MCV 101.0, and platelets of 176. --Patient has had persistent microcytosis with no anemia.  Been no major changes since we have been following his blood counts in 2020.  Would recommend discharge from clinic with referral in the event he would develop any new or worsening hematological issues. --RTC PRN  No orders of the defined types were placed in this encounter.   All questions were answered. The patient knows to call the clinic with any problems, questions or concerns.  A total of more than 25 minutes were spent on this encounter and over half of that time was spent on counseling and coordination of care as outlined above.   Ledell Peoples, MD Department of Hematology/Oncology [place] at [facility] Phone: [phone] Pager: [phone] Email: [email or web address]  02/21/2022 5:05 PM

## 2022-05-10 DIAGNOSIS — E162 Hypoglycemia, unspecified: Secondary | ICD-10-CM | POA: Diagnosis not present

## 2022-05-10 DIAGNOSIS — D7589 Other specified diseases of blood and blood-forming organs: Secondary | ICD-10-CM | POA: Diagnosis not present

## 2022-05-10 DIAGNOSIS — E785 Hyperlipidemia, unspecified: Secondary | ICD-10-CM | POA: Diagnosis not present

## 2022-05-10 DIAGNOSIS — Z8719 Personal history of other diseases of the digestive system: Secondary | ICD-10-CM | POA: Diagnosis not present

## 2022-05-10 DIAGNOSIS — G4730 Sleep apnea, unspecified: Secondary | ICD-10-CM | POA: Diagnosis not present

## 2022-05-10 DIAGNOSIS — I77810 Thoracic aortic ectasia: Secondary | ICD-10-CM | POA: Diagnosis not present

## 2022-05-10 DIAGNOSIS — K219 Gastro-esophageal reflux disease without esophagitis: Secondary | ICD-10-CM | POA: Diagnosis not present

## 2022-05-10 DIAGNOSIS — Z0000 Encounter for general adult medical examination without abnormal findings: Secondary | ICD-10-CM | POA: Diagnosis not present

## 2022-05-10 DIAGNOSIS — E041 Nontoxic single thyroid nodule: Secondary | ICD-10-CM | POA: Diagnosis not present

## 2022-05-10 DIAGNOSIS — Z1211 Encounter for screening for malignant neoplasm of colon: Secondary | ICD-10-CM | POA: Diagnosis not present

## 2022-05-13 DIAGNOSIS — Z1211 Encounter for screening for malignant neoplasm of colon: Secondary | ICD-10-CM | POA: Diagnosis not present

## 2022-06-08 ENCOUNTER — Ambulatory Visit
Admission: RE | Admit: 2022-06-08 | Discharge: 2022-06-08 | Disposition: A | Discharge status: Home / Self Care | Payer: Medicare Other | Source: Ambulatory Visit | Attending: Family Medicine | Admitting: Family Medicine

## 2022-06-08 DIAGNOSIS — I77810 Thoracic aortic ectasia: Secondary | ICD-10-CM

## 2022-06-08 DIAGNOSIS — I712 Thoracic aortic aneurysm, without rupture, unspecified: Secondary | ICD-10-CM | POA: Diagnosis not present

## 2022-06-08 DIAGNOSIS — J9811 Atelectasis: Secondary | ICD-10-CM | POA: Diagnosis not present

## 2022-06-08 MED ORDER — IOPAMIDOL (ISOVUE-370) INJECTION 76%
75.0000 mL | Freq: Once | INTRAVENOUS | Status: AC | PRN
  Administered 2022-06-08: 75 mL via INTRAVENOUS

## 2022-08-15 ENCOUNTER — Other Ambulatory Visit: Payer: Self-pay | Admitting: Urology

## 2022-08-15 DIAGNOSIS — R972 Elevated prostate specific antigen [PSA]: Secondary | ICD-10-CM

## 2022-09-03 DIAGNOSIS — R059 Cough, unspecified: Secondary | ICD-10-CM | POA: Diagnosis not present

## 2022-09-16 ENCOUNTER — Ambulatory Visit
Admission: RE | Admit: 2022-09-16 | Discharge: 2022-09-16 | Disposition: A | Discharge status: Home / Self Care | Payer: Medicare Other | Source: Ambulatory Visit | Attending: Urology | Admitting: Urology

## 2022-09-16 DIAGNOSIS — R972 Elevated prostate specific antigen [PSA]: Secondary | ICD-10-CM

## 2022-09-16 MED ORDER — GADOPICLENOL 0.5 MMOL/ML IV SOLN
9.0000 mL | Freq: Once | INTRAVENOUS | Status: AC | PRN
  Administered 2022-09-16: 9 mL via INTRAVENOUS

## 2022-10-06 DIAGNOSIS — C61 Malignant neoplasm of prostate: Secondary | ICD-10-CM

## 2022-10-06 HISTORY — DX: Malignant neoplasm of prostate: C61

## 2022-10-18 DIAGNOSIS — R972 Elevated prostate specific antigen [PSA]: Secondary | ICD-10-CM

## 2022-10-18 DIAGNOSIS — N400 Enlarged prostate without lower urinary tract symptoms: Secondary | ICD-10-CM

## 2022-10-18 DIAGNOSIS — N529 Male erectile dysfunction, unspecified: Secondary | ICD-10-CM

## 2022-10-18 HISTORY — DX: Male erectile dysfunction, unspecified: N52.9

## 2022-10-18 HISTORY — DX: Benign prostatic hyperplasia without lower urinary tract symptoms: N40.0

## 2022-10-18 HISTORY — PX: PROSTATE BIOPSY: SHX241

## 2022-10-18 HISTORY — DX: Elevated prostate specific antigen (PSA): R97.20

## 2022-11-02 ENCOUNTER — Other Ambulatory Visit (HOSPITAL_COMMUNITY): Payer: Self-pay | Admitting: Urology

## 2022-11-02 DIAGNOSIS — C61 Malignant neoplasm of prostate: Secondary | ICD-10-CM

## 2022-11-04 ENCOUNTER — Telehealth: Payer: Self-pay | Admitting: Radiation Oncology

## 2022-11-04 NOTE — Telephone Encounter (Signed)
Left message for patient to call back to schedule consult per 2/23 referral.

## 2022-11-15 ENCOUNTER — Encounter: Payer: Self-pay | Admitting: Radiation Oncology

## 2022-11-15 NOTE — Progress Notes (Signed)
GU Location of Tumor / Histology: Prostate Ca  If Prostate Cancer, Gleason Score is (4 + 3) and PSA is (4.46 on 07/2022)  Biopsies      11/17/2022 Dr. Rexene Alberts NM PET (PSMA) Skull to Mid Thigh CLINICAL DATA: Prostate carcinoma with biochemical recurrence.   FINDINGS: NECK No radiotracer activity in neck lymph nodes.   Incidental CT finding: None.   CHEST No radiotracer accumulation within mediastinal or hilar lymph nodes.  No suspicious pulmonary nodules on the CT scan.   Incidental CT finding: Coronary artery calcification and aortic atherosclerotic calcification.   ABDOMEN/PELVIS Prostate: Focal activity in the LEFT lobe of the prostate gland consistent with primary prostatic adenocarcinoma (SUV max equal 9 on image 21)   Lymph nodes: No abnormal radiotracer accumulation within pelvic or abdominal nodes.   Liver: No evidence of liver metastasis.   Incidental CT finding: None.   SKELETON No focal activity to suggest skeletal metastasis. Mild radiotracer activity in the lateral LEFT rib (image 117).   IMPRESSION: 1. Focal activity in the LEFT lobe of the prostate gland consistent primary prostate adenocarcinoma. 2. No evidence of metastatic adenopathy in the pelvis or periaortic retroperitoneum. 3. No evidence of visceral metastasis or skeletal metastasis. 4. Mild focal radiotracer activity in lateral LEFT rib is favored posttraumatic or other benign etiology. Aortic Atherosclerosis (ICD10-I70.0).   Past/Anticipated interventions by urology, if any:     Past/Anticipated interventions by medical oncology, if any: NA  Weight changes, if any:  No  IPSS:  5 SHIM:  5  Bowel/Bladder complaints, if any:  No  Nausea/Vomiting, if any: No  Pain issues, if any:  0/10  SAFETY ISSUES: Prior radiation?  No Pacemaker/ICD? No Possible current pregnancy? Male Is the patient on methotrexate? No  Current Complaints / other details:

## 2022-11-17 ENCOUNTER — Encounter (HOSPITAL_COMMUNITY)
Admission: RE | Admit: 2022-11-17 | Discharge: 2022-11-17 | Disposition: A | Discharge status: Home / Self Care | Payer: Medicare Other | Source: Ambulatory Visit | Attending: Urology | Admitting: Urology

## 2022-11-17 DIAGNOSIS — C61 Malignant neoplasm of prostate: Secondary | ICD-10-CM | POA: Diagnosis present

## 2022-11-17 MED ORDER — PIFLIFOLASTAT F 18 (PYLARIFY) INJECTION
9.0000 | Freq: Once | INTRAVENOUS | Status: AC
  Administered 2022-11-17: 9.9 via INTRAVENOUS

## 2022-11-18 ENCOUNTER — Ambulatory Visit
Admission: RE | Admit: 2022-11-18 | Discharge: 2022-11-18 | Disposition: A | Discharge status: Home / Self Care | Payer: Medicare Other | Source: Ambulatory Visit | Attending: Radiation Oncology | Admitting: Radiation Oncology

## 2022-11-18 ENCOUNTER — Other Ambulatory Visit: Payer: Self-pay

## 2022-11-18 ENCOUNTER — Encounter: Payer: Self-pay | Admitting: Radiation Oncology

## 2022-11-18 ENCOUNTER — Encounter: Payer: Self-pay | Admitting: Urology

## 2022-11-18 VITALS — BP 118/76 | HR 88 | Temp 98.2°F | Resp 18 | Ht 69.0 in | Wt 219.4 lb

## 2022-11-18 DIAGNOSIS — G4730 Sleep apnea, unspecified: Secondary | ICD-10-CM | POA: Diagnosis not present

## 2022-11-18 DIAGNOSIS — Z923 Personal history of irradiation: Secondary | ICD-10-CM | POA: Insufficient documentation

## 2022-11-18 DIAGNOSIS — C61 Malignant neoplasm of prostate: Secondary | ICD-10-CM

## 2022-11-18 DIAGNOSIS — I700 Atherosclerosis of aorta: Secondary | ICD-10-CM | POA: Insufficient documentation

## 2022-11-18 DIAGNOSIS — E785 Hyperlipidemia, unspecified: Secondary | ICD-10-CM | POA: Insufficient documentation

## 2022-11-18 DIAGNOSIS — K219 Gastro-esophageal reflux disease without esophagitis: Secondary | ICD-10-CM | POA: Diagnosis not present

## 2022-11-18 DIAGNOSIS — Z191 Hormone sensitive malignancy status: Secondary | ICD-10-CM | POA: Diagnosis not present

## 2022-11-18 HISTORY — DX: Urinary calculus, unspecified: N20.9

## 2022-11-18 HISTORY — DX: Unspecified osteoarthritis, unspecified site: M19.90

## 2022-11-18 HISTORY — DX: Gastro-esophageal reflux disease without esophagitis: K21.9

## 2022-11-18 NOTE — Progress Notes (Signed)
Radiation Oncology         [phone] ________________________________  Initial Outpatient Consultation  Name: Casey Mullen MRN: [id number]  Date: 11/18/2022  DOB: 04/15/1951  CC:Harlan Stains, MD  Janith Lima, MD   REFERRING PHYSICIAN: Janith Lima, MD  DIAGNOSIS: 72 y.o. gentleman with Stage T1c adenocarcinoma of the prostate with Gleason score of 4+5, and PSA of 4.46.    ICD-10-CM   1. Prostate cancer [facility])  C61       HISTORY OF PRESENT ILLNESS: Casey Mullen is a 72 y.o. male with a diagnosis of prostate cancer. He was noted to have an elevated PSA of 4.7 on routine labs in 05/2022 with his primary care physician, Dr. Dema Severin.  Repeat PSA in 06/2022 remained elevated at 5.7 so he was referred for evaluation in urology by Dr. Abner Greenspan on 08/02/22,  digital rectal examination performed at that time showed no nodules. A repeat PSA obtained that day showed a decrease but persistent elevation at 4.46. He underwent prostate MRI on 09/16/22 for further evaluation and this showed a PI-RADS 4 lesion in the left peripheral zone mid gland. Therefore, the patient proceeded to MRI fusion biopsy of the prostate on 10/18/22.  The prostate volume measured 39 cc.  Out of 16 core biopsies, 6 were positive.  The maximum Gleason score was 4+5, and this was seen in one of four samples from the MRI ROI (two foci, with perineural invasion). Additionally, Gleason 4+3 was seen in the left mid (with PNI) and left mid lateral, Gleason 3+4 in one of four samples from the MRI ROI, and small foci of Gleason 3+3 in the left apex and right apex lateral.  Disease staging PSMA PET scan 11/17/22 showed no evidence of metastatic disease.  The patient reviewed the biopsy results with his urologist and he has kindly been referred today for discussion of potential radiation treatment options. He is accompanied by his wife, Casey Mullen, for today's visit.   PREVIOUS RADIATION THERAPY: No  PAST MEDICAL HISTORY:  Past Medical  History:  Diagnosis Date   Arthritis    BPH without obstruction/lower urinary tract symptoms 10/18/2022   08/02/2022   ED (erectile dysfunction) 10/18/2022   08/02/2022   Elevated PSA 10/18/2022   08/02/2022   Gastric ulcer 2014   GERD (gastroesophageal reflux disease)    Hyperlipidemia    Nephrolithiasis 2014   Sleep apnea 2014   Ureteral calculus 2014   Urolithiasis       PAST SURGICAL HISTORY: Past Surgical History:  Procedure Laterality Date   PROSTATE BIOPSY  10/18/2022   UVULOPALATOPHARYNGOPLASTY      FAMILY HISTORY:  Family History  Problem Relation Age of Onset   Kidney failure Father    Nephrolithiasis Father    Hypertension Sister    Hypertension Brother     SOCIAL HISTORY: He is an avid Psychologist, forensic Social History   Socioeconomic History   Marital status: Married    Spouse name: Not on file   Number of children: Not on file   Years of education: Not on file   Highest education level: Not on file  Occupational History   Not on file  Tobacco Use   Smoking status: Never   Smokeless tobacco: Never  Vaping Use   Vaping Use: Never used  Substance and Sexual Activity   Alcohol use: Yes    Comment: caffeine use   Drug use: Never   Sexual activity: Not on file  Other Topics Concern  Not on file  Social History Narrative   Not on file   Social Determinants of Health   Financial Resource Strain: Not on file  Food Insecurity: No Food Insecurity (11/18/2022)   Hunger Vital Sign    Worried About Running Out of Food in the Last Year: Never true    Ran Out of Food in the Last Year: Never true  Transportation Needs: No Transportation Needs (11/18/2022)   PRAPARE - Hydrologist (Medical): No    Lack of Transportation (Non-Medical): No  Physical Activity: Not on file  Stress: Not on file  Social Connections: Not on file  Intimate Partner Violence: Not At Risk (11/18/2022)   Humiliation, Afraid, Rape, and Kick  questionnaire    Fear of Current or Ex-Partner: No    Emotionally Abused: No    Physically Abused: No    Sexually Abused: No    ALLERGIES: Azithromycin and Penicillins  MEDICATIONS:  No current outpatient medications on file.   No current facility-administered medications for this encounter.    REVIEW OF SYSTEMS:  On review of systems, the patient reports that he is doing well overall. He denies any chest pain, shortness of breath, cough, fevers, chills, night sweats, unintended weight changes. He denies any bowel disturbances, and denies abdominal pain, nausea or vomiting. He denies any new musculoskeletal or joint aches or pains. His IPSS was 5, indicating mild urinary symptoms. His SHIM was 5, indicating he has severe erectile dysfunction. A complete review of systems is obtained and is otherwise negative.    PHYSICAL EXAM:  Wt Readings from Last 3 Encounters:  11/18/22 219 lb 6.4 oz (99.5 kg)  02/21/22 212 lb 8 oz (96.4 kg)  02/19/21 208 lb 1.6 oz (94.4 kg)   Temp Readings from Last 3 Encounters:  11/18/22 98.2 F (36.8 C) (Oral)  02/21/22 97.8 F (36.6 C) (Tympanic)  02/19/21 99.6 F (37.6 C) (Tympanic)   BP Readings from Last 3 Encounters:  11/18/22 118/76  02/21/22 128/65  02/19/21 140/71   Pulse Readings from Last 3 Encounters:  11/18/22 88  02/21/22 87  02/19/21 86   Pain Assessment Pain Score: 0-No pain/10  In general this is a well appearing Asian male in no acute distress. He's alert and oriented x4 and appropriate throughout the examination. Cardiopulmonary assessment is negative for acute distress, and he exhibits normal effort.     KPS = 100  100 - Normal; no complaints; no evidence of disease. 90   - Able to carry on normal activity; minor signs or symptoms of disease. 80   - Normal activity with effort; some signs or symptoms of disease. 37   - Cares for self; unable to carry on normal activity or to do active work. 60   - Requires occasional  assistance, but is able to care for most of his personal needs. 50   - Requires considerable assistance and frequent medical care. 76   - Disabled; requires special care and assistance. 40   - Severely disabled; hospital admission is indicated although death not imminent. 39   - Very sick; hospital admission necessary; active supportive treatment necessary. 10   - Moribund; fatal processes progressing rapidly. 0     - Dead  Karnofsky DA, Abelmann WH, Craver LS and [facility] [phone]) The use of the nitrogen mustards in the palliative treatment of carcinoma: with particular reference to bronchogenic carcinoma Cancer 1 634-56  LABORATORY DATA:  Lab Results  Component Value Date  WBC 7.5 02/21/2022   HGB 13.3 02/21/2022   HCT 39.3 02/21/2022   MCV 101.0 (H) 02/21/2022   PLT 176 02/21/2022   Lab Results  Component Value Date   NA 138 02/21/2022   K 4.3 02/21/2022   CL 107 02/21/2022   CO2 24 02/21/2022   Lab Results  Component Value Date   ALT 19 02/21/2022   AST 21 02/21/2022   ALKPHOS 81 02/21/2022   BILITOT 1.0 02/21/2022     RADIOGRAPHY: NM PET (PSMA) SKULL TO MID THIGH  Result Date: 11/17/2022 CLINICAL DATA:  Prostate carcinoma with biochemical recurrence. EXAM: NUCLEAR MEDICINE PET SKULL BASE TO THIGH TECHNIQUE: 9.1 mCi F18 Piflufolastat (Pylarify) was injected intravenously. Full-ring PET imaging was performed from the skull base to thigh after the radiotracer. CT data was obtained and used for attenuation correction and anatomic localization. COMPARISON:  None Available. FINDINGS: NECK No radiotracer activity in neck lymph nodes. Incidental CT finding: None. CHEST No radiotracer accumulation within mediastinal or hilar lymph nodes. No suspicious pulmonary nodules on the CT scan. Incidental CT finding: Coronary artery calcification and aortic atherosclerotic calcification. ABDOMEN/PELVIS Prostate: Focal activity in the LEFT lobe of the prostate gland consistent with primary  prostatic adenocarcinoma (SUV max equal 9 on image 21) Lymph nodes: No abnormal radiotracer accumulation within pelvic or abdominal nodes. Liver: No evidence of liver metastasis. Incidental CT finding: None. SKELETON No focal activity to suggest skeletal metastasis. Mild radiotracer activity in the lateral LEFT rib (image 117). IMPRESSION: 1. Focal activity in the LEFT lobe of the prostate gland consistent primary prostate adenocarcinoma. 2. No evidence of metastatic adenopathy in the pelvis or periaortic retroperitoneum. 3. No evidence of visceral metastasis or skeletal metastasis. 4. Mild focal radiotracer activity in lateral LEFT rib is favored posttraumatic or other benign etiology Aortic Atherosclerosis (ICD10-I70.0). Electronically Signed   By: Suzy Bouchard M.D.   On: 11/17/2022 16:37      IMPRESSION/PLAN: 1. 72 y.o. gentleman with Stage T1c adenocarcinoma of the prostate with Gleason Score of 4+5, and PSA of 4.5. We discussed the patient's workup and outlined the nature of prostate cancer in this setting. The patient's T stage, Gleason's score, and PSA put him into the high risk group. Accordingly, he is eligible for a variety of potential treatment options including prostatectomy or LT-ADT in combination with either 8 weeks of external radiation or 5 weeks of external radiation with an upfront brachytherapy boost. We discussed the available radiation techniques, and focused on the details and logistics of delivery. We discussed and outlined the risks, benefits, short and long-term effects associated with radiotherapy and compared and contrasted these with prostatectomy. We discussed the role of SpaceOAR gel in reducing the rectal toxicity associated with radiotherapy. We also detailed the role of ADT in the treatment of high risk prostate cancer and outlined the associated side effects that could be expected with this therapy. He was encouraged to ask questions that were answered to his stated  satisfaction.  At the conclusion of our conversation, the patient is interested in moving forward with LT-ADT concurrent with a brachytherapy boost and SpaceOAR gel placement followed by a 5 week course of daily external beam radiotherapy. We will share our discussion with Dr. Abner Greenspan and coordinate for a follow up visit in the urology office to start ADT, first available. The patient will be contacted by Romie Jumper in our office who will be working closely with him to coordinate OR scheduling and pre and post procedure appointments for the  brachytherapy boost procedure in May 2024, and we will contact the pharmaceutical rep to ensure that Cherre Blanc is available at the time of procedure.  We will see him back in the office 2-3 weeks after his seed boost procedure and will begin planning for the daily external beam radiation at that time, in anticipation of beginning the EBRT approximately 3-4 weeks after seed boost. He appears to have a good understanding of his disease and our treatment recommendations which are of curative intent and he is in agreement with the stated plan. We enjoyed meeting him and his wife, Casey Mullen, today and look forward to continuing to participate in his care. They know that they are welcome to call at any time with any further questions or concerns regarding the radiation treatment recommendations discussed today.  We personally spent 70 minutes in this encounter including chart review, reviewing radiological studies, meeting face-to-face with the patient, entering orders and completing documentation.    Nicholos Johns, PA-C    Tyler Pita, MD  [facility] Direct Dial: [phone]  Fax: [phone] [email or web address]  Skype  LinkedIn   This document serves as a record of services personally performed by Tyler Pita, MD and Freeman Caldron, PA-C. It was created on their behalf by Wilburn Mylar, a trained medical scribe. The creation of this  record is based on the scribe's personal observations and the provider's statements to them. This document has been checked and approved by the attending provider.

## 2022-11-20 DIAGNOSIS — Z191 Hormone sensitive malignancy status: Secondary | ICD-10-CM | POA: Diagnosis not present

## 2022-11-21 ENCOUNTER — Telehealth: Payer: Self-pay | Admitting: *Deleted

## 2022-11-21 NOTE — Progress Notes (Signed)
Introduced myself to the patient as the prostate nurse navigator.  No barriers to care identified at this time.  He is here to discuss his radiation treatment options.  I gave him my business card and asked him to call me with questions or concerns.  Verbalized understanding.  ?

## 2022-11-21 NOTE — Telephone Encounter (Signed)
Called patient to inform of ADT for 12-06-22- arrival time- 7:30 am @ [facility], spoke with patient and he is aware of this appt.

## 2022-11-24 ENCOUNTER — Telehealth: Payer: Self-pay | Admitting: *Deleted

## 2022-11-24 NOTE — Telephone Encounter (Signed)
Returned patient's wife's phone call, spoke with patient's wife 

## 2022-11-25 ENCOUNTER — Telehealth: Payer: Self-pay | Admitting: *Deleted

## 2022-11-25 NOTE — Telephone Encounter (Signed)
RETURNED PATIENT'S PHONE CALL, SPOKE WITH PATIENT'S WIFE 

## 2022-11-28 ENCOUNTER — Telehealth: Payer: Self-pay | Admitting: *Deleted

## 2022-11-28 NOTE — Telephone Encounter (Signed)
Returned patient's phone call, spoke with patient 

## 2022-11-28 NOTE — Telephone Encounter (Signed)
Returned patient's phone call, lvm for a return call 

## 2022-11-29 ENCOUNTER — Telehealth: Payer: Self-pay | Admitting: *Deleted

## 2022-11-29 DIAGNOSIS — C61 Malignant neoplasm of prostate: Secondary | ICD-10-CM

## 2022-11-29 NOTE — Progress Notes (Signed)
RN spoke with patient and his wife regarding his decision to change treatment to radiation only.   RN provided extensive education. Radiation alone, without ADT, is not advised with high-risk disease as this would decrease the likelihood for cure from 85% (with ADT) down to 30% chance for cure.   Patient and wife verbalized understanding and would like to proceed with LT-ADT along with 8 weeks of daily radiation.  They would feel comfortable seeing Dr. Abner Greenspan prior to initiation of ADT.   Will follow up to ensure patient gets scheduled with Dr. Abner Greenspan at [facility].

## 2022-11-29 NOTE — Telephone Encounter (Signed)
RETURNED PATIENT'S PHONE CALL, SPOKE WITH PATIENT. ?

## 2022-11-29 NOTE — Telephone Encounter (Signed)
Called patient to inform of ADT appt. for 12-14-22- arrival time- 11:30 am @ Dr. Virgina [facility] Office, spoke with patient and he is aware this appt.

## 2022-11-29 NOTE — Addendum Note (Signed)
Addended by: Rennis Harding on: 11/29/2022 02:15 PM   Modules accepted: Orders

## 2022-11-30 ENCOUNTER — Telehealth: Payer: Self-pay | Admitting: Family Medicine

## 2022-11-30 NOTE — Telephone Encounter (Signed)
Per 3/27 IB reached out to patient to schedule

## 2022-12-12 ENCOUNTER — Other Ambulatory Visit: Payer: Self-pay | Admitting: Genetic Counselor

## 2022-12-12 ENCOUNTER — Other Ambulatory Visit: Payer: Self-pay

## 2022-12-12 ENCOUNTER — Inpatient Hospital Stay: Payer: Medicare Other | Attending: Genetic Counselor | Admitting: Genetic Counselor

## 2022-12-12 ENCOUNTER — Inpatient Hospital Stay: Payer: Medicare Other

## 2022-12-12 DIAGNOSIS — C61 Malignant neoplasm of prostate: Secondary | ICD-10-CM

## 2022-12-12 LAB — GENETIC SCREENING ORDER

## 2022-12-12 NOTE — Progress Notes (Signed)
REFERRING PROVIDER: Margaretmary Dys, MD [address] [place],  [place] [address]  PRIMARY PROVIDER:  [name], MD  PRIMARY REASON FOR VISIT:  Encounter Diagnosis  Name Primary?   Malignant neoplasm of prostate Yes    HISTORY OF PRESENT ILLNESS:   Mr. Chea, a 72 y.o. male, was seen for a [place] cancer genetics consultation at the request of Dr. Kathrynn Running due to a personal history of prostate cancer.  Mr. Hebda presents to clinic today to discuss the possibility of a hereditary predisposition to cancer, to discuss genetic testing, and to further clarify his future cancer risks, as well as potential cancer risks for family members.   In 2024, at the age of 35, Mr. Reilley was diagnosed with adenocarcinoma of the prostate (high risk group).    CANCER HISTORY:  Oncology History  Malignant neoplasm of prostate  10/18/2022 Cancer Staging   Staging form: Prostate, AJCC 8th Edition - Clinical stage from 10/18/2022: Stage IIIC (cT1c, cN0, cM0, PSA: 4.5, Grade Group: 5) - Signed by Marcello Fennel, PA-C on 11/18/2022 Histopathologic type: Adenocarcinoma, NOS Stage prefix: Initial diagnosis Prostate specific antigen (PSA) range: Less than 10 Gleason primary pattern: 4 Gleason secondary pattern: 5 Gleason score: 9 Histologic grading system: 5 grade system Number of biopsy cores examined: 16 Number of biopsy cores positive: 6 Location of positive needle core biopsies: Both sides   11/18/2022 Initial Diagnosis   Malignant neoplasm of prostate (HCC)      RISK FACTORS:  Colonoscopy: yes;  less than 10 lifetime polyps .    Past Medical History:  Diagnosis Date   Arthritis    BPH without obstruction/lower urinary tract symptoms 10/18/2022   08/02/2022   ED (erectile dysfunction) 10/18/2022   08/02/2022   Elevated PSA 10/18/2022   08/02/2022   Gastric ulcer 2014   GERD (gastroesophageal reflux disease)    Hyperlipidemia    Nephrolithiasis 2014   Sleep apnea 2014    Ureteral calculus 2014   Urolithiasis     Past Surgical History:  Procedure Laterality Date   PROSTATE BIOPSY  10/18/2022   UVULOPALATOPHARYNGOPLASTY      FAMILY HISTORY:  We obtained a detailed, 4-generation family history.  He did not report a known family history of prostate cancer.      Mr. Swiggett is unaware of previous family history of genetic testing for hereditary cancer risks. Patient's maternal ancestors are of [place] descent, and paternal ancestors are of [place] descent. There is no reported Ashkenazi Jewish ancestry. There is no known consanguinity.  GENETIC COUNSELING ASSESSMENT: Mr. Garavito is a 72 y.o. male with a personal history of high risk prostate cancer which is somewhat suggestive of a hereditary cancer syndrome.  We, therefore, discussed and recommended the following at today's visit.   DISCUSSION: We discussed that 5 - 10% of cancer is hereditary.  Most cases of hereditary prostate cancer is associated with mutations in BRCA1/2.  There are other genes that can be associated with hereditary prostate cancer syndromes.  We discussed that testing is beneficial for several reasons including knowing how to follow individuals for their cancer risks, and understanding if other family members could be at risk for cancer and allowing them to undergo genetic testing.   We reviewed the characteristics, features and inheritance patterns of hereditary cancer syndromes. We also discussed genetic testing, including the appropriate family members to test, the process of testing, insurance coverage and turn-around-time for results. We discussed the implications of a negative, positive, carrier and/or variant  of uncertain significant result. We recommended Mr. Castles pursue genetic testing for a panel that includes genes associated with prostate cancer and other cancer.   Mr. Dorow  was offered a common hereditary cancer panel (48 genes) and an expanded pan-cancer panel (70 genes).  Mr. Medor was informed of the benefits and limitations of each panel, including that expanded pan-cancer panels contain genes that do not have clear management guidelines at this point in time.  We also discussed that as the number of genes included on a panel increases, the chances of variants of uncertain significance increases.  After considering the benefits and limitations of each gene panel, Mr. Bezio  elected to have a common hereditary cancers panel through [facility].   The [facility] Common Hereditary Cancers + RNA Panel includes sequencing, deletion/duplication, and RNA analysis of the following 48 genes: APC, ATM, AXIN2, BAP1, BARD1, BMPR1A, BRCA1, BRCA2, BRIP1, CDH1, CDK4*, CDKN2A*, CHEK2, CTNNA1, DICER1, EPCAM* (del/dup only), FH, GREM1* (promoter dup analysis only), HOXB13*, KIT*, MBD4*, MEN1, MLH1, MSH2, MSH3, MSH6, MUTYH, NF1, NTHL1, PALB2, PDGFRA*, PMS2, POLD1, POLE, PTEN, RAD51C, RAD51D, SDHA (sequencing only), SDHB, SDHC, SDHD, SMAD4, SMARCA4, STK11, TP53, TSC1, TSC2, VHL.  *Genes without RNA analysis.    Based on Mr. Mccrimon personal history of high risk prostate cancer cancer, he meets medical criteria for genetic testing. Despite that he meets criteria, he may still have an out of pocket cost. We discussed that if his out of pocket cost for testing is over $100, the laboratory should contact him and discuss the self-pay prices and/or patient pay assistance programs.    PLAN: After considering the risks, benefits, and limitations, Mr. Armijo provided informed consent to pursue genetic testing and the blood sample was sent to [facility] for analysis of the Common Hereditary Cancers +RNA Panel. Results should be available within approximately 3 weeks' time, at which point they will be disclosed by telephone to Mr. Shines, as will any additional recommendations warranted by these results. Mr. Throop will receive a summary of his genetic counseling visit and a copy of his results once  available. This information will also be available in Epic.   Mr. Sepp questions were answered to his satisfaction today. Our contact information was provided should additional questions or concerns arise. Thank you for the referral and allowing [place] to share in the care of your patient.   Starlette Thurow M. Rennie Plowman, MS, [facility] Genetic Counselor [email or web address] (P) [phone]  The patient was seen for a total of 40 minutes in face-to-face genetic counseling.  The patient was accompanied by his wife.  Drs. Pamelia Hoit and/or Mosetta Putt were available to discuss this case as needed.    _______________________________________________________________________ For Office Staff:  Number of people involved in session: 2 Was an Intern/ student involved with case: yes; [employer] student [facility] conducted session under my direct supervision

## 2022-12-15 ENCOUNTER — Telehealth: Payer: Self-pay | Admitting: *Deleted

## 2022-12-15 NOTE — Telephone Encounter (Signed)
RETURNED PATIENT'S PHONE CALL, SPOKE WITH PATIENT. ?

## 2022-12-16 ENCOUNTER — Telehealth: Payer: Self-pay | Admitting: *Deleted

## 2022-12-16 NOTE — Telephone Encounter (Signed)
CALLED PATIENT TO UPDATE, SPOKE WITH PATIENT °

## 2022-12-19 ENCOUNTER — Telehealth: Payer: Self-pay | Admitting: *Deleted

## 2022-12-19 NOTE — Telephone Encounter (Signed)
RETURNED PATIENT'S PHONE CALL, SPOKE WITH PATIENT. ?

## 2022-12-21 ENCOUNTER — Other Ambulatory Visit: Payer: Self-pay | Admitting: Urology

## 2022-12-21 ENCOUNTER — Ambulatory Visit: Payer: Self-pay | Admitting: Genetic Counselor

## 2022-12-21 ENCOUNTER — Encounter: Payer: Self-pay | Admitting: Genetic Counselor

## 2022-12-21 ENCOUNTER — Telehealth: Payer: Self-pay | Admitting: Genetic Counselor

## 2022-12-21 ENCOUNTER — Telehealth: Payer: Self-pay | Admitting: *Deleted

## 2022-12-21 DIAGNOSIS — Z1379 Encounter for other screening for genetic and chromosomal anomalies: Secondary | ICD-10-CM | POA: Insufficient documentation

## 2022-12-21 DIAGNOSIS — C61 Malignant neoplasm of prostate: Secondary | ICD-10-CM

## 2022-12-21 NOTE — Telephone Encounter (Signed)
CALLED PATIENT TO INFORM OF FID. MARKERS AND SPACE OAR PLACEMENT ON 02/07/23 AND HIS SIM APPT. ON 02/09/23- ARRIVAL TIME- 10:45 AM @ [facility], INFORMED PATIENT TO ARRIVE WITH A FULL BLADDER

## 2022-12-21 NOTE — Telephone Encounter (Signed)
Disclosed negative genetics and VUS in KIT.

## 2022-12-21 NOTE — Progress Notes (Signed)
HPI:   Casey Mullen was previously seen in the [facility] due to a personal history of prostate cancer and concerns regarding a hereditary predisposition to cancer. Please refer to our prior cancer genetics clinic note for more information regarding our discussion, assessment and recommendations, at the time. Casey Mullen recent genetic test results were disclosed to him, as were recommendations warranted by these results. These results and recommendations are discussed in more detail below.  CANCER HISTORY:  Oncology History  Malignant neoplasm of prostate  10/18/2022 Cancer Staging   Staging form: Prostate, AJCC 8th Edition - Clinical stage from 10/18/2022: Stage IIIC (cT1c, cN0, cM0, PSA: 4.5, Grade Group: 5) - Signed by Marcello Fennel, PA-C on 11/18/2022 Histopathologic type: Adenocarcinoma, NOS Stage prefix: Initial diagnosis Prostate specific antigen (PSA) range: Less than 10 Gleason primary pattern: 4 Gleason secondary pattern: 5 Gleason score: 9 Histologic grading system: 5 grade system Number of biopsy cores examined: 16 Number of biopsy cores positive: 6 Location of positive needle core biopsies: Both sides   11/18/2022 Initial Diagnosis   Malignant neoplasm of prostate (HCC)   12/18/2022 Genetic Testing   Negative genetics for [facility] Common Hereditary Cancers +RNA Panel.  VUS in KIT at c.2866C>T (p.Arg956Trp). Report date is 12/18/2022.    The [facility] Common Hereditary Cancers + RNA Panel includes sequencing, deletion/duplication, and RNA analysis of the following 48 genes: APC, ATM, AXIN2, BAP1, BARD1, BMPR1A, BRCA1, BRCA2, BRIP1, CDH1, CDK4*, CDKN2A*, CHEK2, CTNNA1, DICER1, EPCAM* (del/dup only), FH, GREM1* (promoter dup analysis only), HOXB13*, KIT*, MBD4*, MEN1, MLH1, MSH2, MSH3, MSH6, MUTYH, NF1, NTHL1, PALB2, PDGFRA*, PMS2, POLD1, POLE, PTEN, RAD51C, RAD51D, SDHA (sequencing only), SDHB, SDHC, SDHD, SMAD4, SMARCA4, STK11, TP53, TSC1, TSC2, VHL.  *Genes  without RNA analysis.      FAMILY HISTORY:  We obtained a detailed, 4-generation family history.  He did not report a known family history of prostate cancer.         Casey Mullen is unaware of previous family history of genetic testing for hereditary cancer risks. Patient's maternal ancestors are of [place] descent, and paternal ancestors are of [place] descent. There is no reported Ashkenazi Jewish ancestry. There is no known consanguinity.    GENETIC TEST RESULTS:  The [facility] Common Hereditary Cancers +RNA Panel found no pathogenic mutations.    The [facility] Common Hereditary Cancers + RNA Panel includes sequencing, deletion/duplication, and RNA analysis of the following 48 genes: APC, ATM, AXIN2, BAP1, BARD1, BMPR1A, BRCA1, BRCA2, BRIP1, CDH1, CDK4*, CDKN2A*, CHEK2, CTNNA1, DICER1, EPCAM* (del/dup only), FH, GREM1* (promoter dup analysis only), HOXB13*, KIT*, MBD4*, MEN1, MLH1, MSH2, MSH3, MSH6, MUTYH, NF1, NTHL1, PALB2, PDGFRA*, PMS2, POLD1, POLE, PTEN, RAD51C, RAD51D, SDHA (sequencing only), SDHB, SDHC, SDHD, SMAD4, SMARCA4, STK11, TP53, TSC1, TSC2, VHL.  *Genes without RNA analysis.   The test report has been scanned into EPIC and is located under the Molecular Pathology section of the Results Review tab.  A portion of the result report is included below for reference. Genetic testing reported out on December 18, 2022.      Genetic testing identified a variant of uncertain significance (VUS) in the KIT gene called c.2866C>T (p.Arg956Trp).  At this time, it is unknown if this variant is associated with an increased risk for cancer or if it is benign, but most uncertain variants are reclassified to benign. It should not be used to make medical management decisions. With time, we suspect the laboratory will determine the significance of this variant, if any. If the  laboratory reclassifies this variant, we will attempt to contact Casey Mullen to discuss it further.   Even though a pathogenic  variant was not identified, possible explanations for the cancer in the family may include: There may be no hereditary risk for cancer in the family. The cancers in Casey Mullen may be sporadic/familial or due to other genetic and environmental factors. There may be a gene mutation in one of these genes that current testing methods cannot detect but that chance is small. There could be another gene that has not yet been discovered, or that we have not yet tested, that is responsible for the cancer diagnoses in the family.  It is also possible there is a hereditary cause for the cancer in the family that Casey Mullen did not inherit.  Therefore, it is important to remain in touch with cancer genetics in the future so that we can continue to offer Casey Mullen the most up to date genetic testing.    ADDITIONAL GENETIC TESTING:  We discussed with Casey Mullen that his genetic testing was fairly extensive.  If there are additional relevant genes identified to increase cancer risk that can be analyzed in the future, we would be happy to discuss and coordinate this testing at that time.      CANCER SCREENING RECOMMENDATIONS:  Casey Mullen test result is considered negative (normal).  This means that we have not identified a hereditary cause for his personal history of prostate cancer at this time.   An individual's cancer risk and medical management are not determined by genetic test results alone. Overall cancer risk assessment incorporates additional factors, including personal medical history, family history, and any available genetic information that may result in a personalized plan for cancer prevention and surveillance. Therefore, it is recommended he continue to follow the cancer management and screening guidelines provided by his oncology and primary healthcare provider.  RECOMMENDATIONS FOR FAMILY MEMBERS:   Since he did not inherit a identifiable mutation in a cancer predisposition gene included on  this panel, his children could not have inherited a known mutation from him in one of these genes. Individuals in this family might be at some increased risk of developing cancer, over the general population risk, due to the family history of cancer.  Individuals in the family should notify their providers of the family history of cancer. We recommend women in this family have a yearly mammogram beginning at age 52, or 1 years younger than the earliest onset of cancer, an annual clinical breast exam, and perform monthly breast self-exams.  Male relatives should speak with their providers about prostate cancer screening. We do not recommend familial testing for the KIT variant of uncertain significance (VUS).  FOLLOW-UP:  Lastly, we discussed with Casey Mullen that cancer genetics is a rapidly advancing field and it is possible that new genetic tests will be appropriate for him and/or his family members in the future. We encouraged him to remain in contact with cancer genetics on an annual basis so we can update his personal and family histories and let him know of advances in cancer genetics that may benefit this family.   Our contact number was provided. Casey Mullen questions were answered to his satisfaction, and he knows he is welcome to call us at anytime with additional questions or concerns.   Huxley Vanwagoner M. Rennie Plowman, MS, [facility] Genetic Counselor [email or web address] (P) [phone]

## 2022-12-22 ENCOUNTER — Other Ambulatory Visit: Payer: Self-pay | Admitting: Urology

## 2023-01-17 ENCOUNTER — Other Ambulatory Visit: Payer: Self-pay | Admitting: Family Medicine

## 2023-01-17 DIAGNOSIS — E041 Nontoxic single thyroid nodule: Secondary | ICD-10-CM

## 2023-01-18 ENCOUNTER — Encounter: Payer: Self-pay | Admitting: Family Medicine

## 2023-01-23 ENCOUNTER — Ambulatory Visit
Admission: RE | Admit: 2023-01-23 | Discharge: 2023-01-23 | Disposition: A | Discharge status: Home / Self Care | Payer: Medicare Other | Source: Ambulatory Visit | Attending: Family Medicine | Admitting: Family Medicine

## 2023-01-23 DIAGNOSIS — E041 Nontoxic single thyroid nodule: Secondary | ICD-10-CM

## 2023-01-24 ENCOUNTER — Encounter (HOSPITAL_BASED_OUTPATIENT_CLINIC_OR_DEPARTMENT_OTHER): Payer: Self-pay | Admitting: Urology

## 2023-01-24 NOTE — Progress Notes (Signed)
Spoke w/ via phone for pre-op interview--- pt Lab needs dos----    no           Lab results------ no COVID test -----patient states asymptomatic no test needed Arrive at ------- 0630 on 02-07-2023 NPO after MN NO Solid Food.  Clear liquids from MN until--- 0530 Med rec completed Medications to take morning of surgery ----- none Diabetic medication ----- n/a Patient instructed no nail polish to be worn day of surgery Patient instructed to bring photo id and insurance card day of surgery Patient aware to have Driver (ride ) / caregiver    for 24 hours after surgery -- wife, Casey Mullen Patient Special Instructions ----- will do one fleet enema night before surgery Pre-Op special Instructions ----- n/a Patient verbalized understanding of instructions that were given at this phone interview. Patient denies shortness of breath, chest pain, fever, cough at this phone interview.

## 2023-02-03 NOTE — Progress Notes (Signed)
RN spoke with patient to assess any additional questions or needs prior to start radiation treatment.  RN confirmed upcoming appointment, time, and locations.  Education provided on lengths of treatment.  Pt agreeable for RN to follow up at end of treatment.  No additional needs at this time.

## 2023-02-05 NOTE — H&P (Signed)
Office Visit Report     01/17/2023   --------------------------------------------------------------------------------   Casey Mullen  MRN: [id number]  DOB: Dec 23, 1950, 72 year old Male  SSN: [id number]   PRIMARY CARE:  [name], MD  PRIMARY CARE FAX:  [phone]  REFERRING:  [name], MD  PROVIDER:  Jettie Pagan, M.D.  TREATING:  [name], [place]  LOCATION:  [facility] [phone]     --------------------------------------------------------------------------------   CC/HPI: Pt presents today for pre-operative history and physical exam in anticipation of fiducial marker and space oar placement by Dr. Elvina Sidle on 02/07/23. He is doing well and his only complaint is of hot flashes. He is concerned about possible side effects of the Orgovyx (HTN, HLD, osteo) and when to begin monitoring for this.   He is having a routine U/S next week for a known thyroid nodule.   Pt denies F/C, HA, CP, SOB, N/V, diarrhea/constipation, back pain, flank pain, hematuria, and dysuria.     HX:   Casey Mullen is a 72 year old male who is seen to discuss his new diagnosis of prostate cancer and definitive treatment options.   #1. High risk prostate cancer:  Patient underwent MRI fusion prostate biopsy on 10/18/2022 for an elevated PSA of 4.46 ng/mL. Biopsy revealed GS 4+5 = 9 in 1 core at the ROI, GS 4+3 = 7 in 2 cores, GS 3+4 = 7 at the ROI, GS 3+3 = 6 in 2 cores, adenocarcinoma of the prostate with 6/16 total cores positive (5-50%), TRUS volume of 39 cm3. Denies new or worsening bone or back pain. Good appetite and stable weight.   Family history: None  Imaging studies: Prostate MRI 09/16/2022 with PI-RADS category 4 lesion in the left peripheral zone mid gland. Prostate volume 25 cc. PSMA PET scan 11/17/2022 with  No evidence of metastatic adenopathy or evidence of skeletal metastasis.   PMH: Hiatal hernia, GERD, OSA. He denies cardiac or pulmonary history.   PSH: He denies prior abdominal surgeries   TNM stage: cT1cNxMx  PSA: 4.46 in 07/2022  Gleason score: GS 4+5+9  Biopsy: 10/18/2022  Left: 4+5 = 9 at the ROI in the left. Additional 4+3 = 7 in the left mid and left lateral mid, GS 3+3 = 6 in the left apex  Right: GS 3+3 = 6 at the right apex  Prostate volume: 39 cc  PSAD: 0.11   Nomogram  CSS (15-year): 92%  PFS (5 year, 10 year): 50%, 42%  EPE: 67%  LNI: 13%  SVI: 11%   He has met with Dr. Kathrynn Running and has elected proceed with EBRT with ADT.   2. BPH without LUTS: DRE today 60 g, no nodules. IPSS score is 4, QOL like 0. He has no significant bothersome symptoms.   #3. Erectile dysfunction: SHIM score is 1. He states that this is not bothersome and does not wish to undergo treatment at this time.   He plays pickleball 6 days a week.     ALLERGIES: Penicillin - Hypersensitivity zpack - Hypersensitivity    MEDICATIONS: Berberine With Insea2  Fish Oil  Orgovyx 120 mg tablet 1 tablet PO Daily  Red Yeast Rice  Triple Action Joint  Turmeric  Vitamin C  Vitamin D3     GU PSH: Prostate Needle Biopsy - 10/18/2022       PSH Notes: "sleep apnea surgery" in throat and mouth    NON-GU PSH: Surgical Pathology, Gross And Microscopic Examination For Prostate Needle - 10/18/2022  Visit Complexity (formerly GPC1X) - 12/14/2022     GU PMH: Prostate Cancer - 01/04/2023, - 12/14/2022, - 10/25/2022 BPH w/o LUTS - 12/14/2022, - 10/18/2022, - 08/02/2022 Elevated PSA - 10/18/2022, - 08/02/2022 ED due to arterial insufficiency - 08/02/2022 History of urolithiasis, Nephrolithiasis - 2014 Ureteral calculus, Calculus of ureter - 2014    NON-GU PMH: Gastric ulcer, unspecified as acute or chronic, without hemorrhage or perforation, Gastric Ulcer - 2014 Personal history of other diseases of the digestive system, History of esophageal reflux - 2014, History of hiatal hernia, - 2014 Personal history of other diseases of the nervous system and sense  organs, History of sleep apnea - 2014 Arthritis Encounter for general adult medical examination without abnormal findings, Encounter for preventive health examination GERD Sleep Apnea    FAMILY HISTORY: 1 son - Runs in Family Death In The Family Father - Father Family Health Status - Mother's Age - Mother Family Health Status Number - Runs In Family nephrolithiasis - Father renal failure - Father   SOCIAL HISTORY: Marital Status: Married Current Smoking Status: Patient has never smoked.   Tobacco Use Assessment Completed: Used Tobacco in last 30 days? Does not use smokeless tobacco. Has never drank.  Does not use drugs. Drinks 1 caffeinated drink per day. Has not had a blood transfusion.     Notes: Never A Smoker, Marital History - Currently Married, Caffeine Use, Alcohol Use, Retired From Work   REVIEW OF SYSTEMS:    GU Review Male:   Patient denies frequent urination, hard to postpone urination, burning/ pain with urination, get up at night to urinate, leakage of urine, stream starts and stops, trouble starting your stream, have to strain to urinate , erection problems, and penile pain.  Gastrointestinal (Upper):   Patient denies nausea, vomiting, and indigestion/ heartburn.  Gastrointestinal (Lower):   Patient denies diarrhea and constipation.  Constitutional:   Patient denies fever, night sweats, weight loss, and fatigue.  Skin:   Patient denies skin rash/ lesion and itching.  Eyes:   Patient denies blurred vision and double vision.  Ears/ Nose/ Throat:   Patient denies sore throat and sinus problems.  Hematologic/Lymphatic:   Patient denies easy bruising and swollen glands.  Cardiovascular:   Patient denies leg swelling and chest pains.  Respiratory:   Patient denies cough and shortness of breath.  Endocrine:   Patient denies excessive thirst.  Musculoskeletal:   Patient denies back pain and joint pain.  Neurological:   Patient denies headaches and dizziness.   Psychologic:   Patient denies depression and anxiety.   VITAL SIGNS:      01/17/2023 03:10 PM  BP 151/85 mmHg  Pulse 73 /min  Temperature 97.8 F / 36.5 C   MULTI-SYSTEM PHYSICAL EXAMINATION:    Constitutional: Well-nourished. No physical deformities. Normally developed. Good grooming.  Neck: Neck symmetrical, not swollen. Normal tracheal position.  Respiratory: Normal breath sounds. No labored breathing, no use of accessory muscles.   Cardiovascular: Regular rate and rhythm. No murmur, no gallop.   Lymphatic: No enlargement of neck, axillae, groin.  Skin: No paleness, no jaundice, no cyanosis. No lesion, no ulcer, no rash.  Neurologic / Psychiatric: Oriented to time, oriented to place, oriented to person. No depression, no anxiety, no agitation.  Gastrointestinal: No mass, no tenderness, no rigidity, obese abdomen.   Eyes: Normal conjunctivae. Normal eyelids.  Ears, Nose, Mouth, and Throat: Left ear no scars, no lesions, no masses. Right ear no scars, no lesions, no masses. Nose no scars,  no lesions, no masses. Normal hearing. Normal lips.  Musculoskeletal: Normal gait and station of head and neck.     Complexity of Data:  Records Review:   Previous Patient Records  Urine Test Review:   Urinalysis   01/17/23  Urinalysis  Urine Appearance Clear   Urine Color Yellow   Urine Glucose Neg mg/dL  Urine Bilirubin Neg mg/dL  Urine Ketones Neg mg/dL  Urine Specific Gravity 1.015   Urine Blood Neg ery/uL  Urine pH 6.5   Urine Protein Neg mg/dL  Urine Urobilinogen 0.2 mg/dL  Urine Nitrites Neg   Urine Leukocyte Esterase Neg leu/uL   PROCEDURES:          Urinalysis - 81003 Dipstick Dipstick Cont'd  Color: Yellow Bilirubin: Neg mg/dL  Appearance: Clear Ketones: Neg mg/dL  Specific Gravity: 2.536 Blood: Neg ery/uL  pH: 6.5 Protein: Neg mg/dL  Glucose: Neg mg/dL Urobilinogen: 0.2 mg/dL    Nitrites: Neg    Leukocyte Esterase: Neg leu/uL    ASSESSMENT:      ICD-10 Details  1  GU:   Prostate Cancer - C61    PLAN:           Schedule Return Visit/Planned Activity: Keep Scheduled Appointment - Schedule Surgery          Document Letter(s):  Created for Patient: Clinical Summary         Notes:   There are no changes in the patients history or physical exam since last evaluation by Dr. Cardell Peach. Pt is scheduled to undergo fiducial marker and space oar placement on 02/07/23.   All pt's questions were answered to the best of my ability.          Next Appointment:      Next Appointment: 02/07/2023 09:30 AM    Appointment Type: Surgery     Location: [facility] [phone]    Provider: Jerilee Field, M.D.    Reason for Visit: NE/OP FIDUCIAL MARKERS AND SPACE OAR      * Signed by [name], PA on 01/17/23 at 3:59 PM (EDT)*

## 2023-02-07 ENCOUNTER — Ambulatory Visit (HOSPITAL_BASED_OUTPATIENT_CLINIC_OR_DEPARTMENT_OTHER): Payer: Medicare Other | Admitting: Anesthesiology

## 2023-02-07 ENCOUNTER — Encounter (HOSPITAL_BASED_OUTPATIENT_CLINIC_OR_DEPARTMENT_OTHER)
Admission: RE | Disposition: A | Discharge status: Home / Self Care | Payer: Self-pay | Source: Home / Self Care | Attending: Urology

## 2023-02-07 ENCOUNTER — Encounter (HOSPITAL_BASED_OUTPATIENT_CLINIC_OR_DEPARTMENT_OTHER): Payer: Self-pay | Admitting: Urology

## 2023-02-07 ENCOUNTER — Other Ambulatory Visit: Payer: Self-pay

## 2023-02-07 ENCOUNTER — Telehealth: Payer: Self-pay | Admitting: *Deleted

## 2023-02-07 ENCOUNTER — Ambulatory Visit (HOSPITAL_BASED_OUTPATIENT_CLINIC_OR_DEPARTMENT_OTHER)
Admission: RE | Admit: 2023-02-07 | Discharge: 2023-02-07 | Disposition: A | Discharge status: Home / Self Care | Payer: Medicare Other | Attending: Urology | Admitting: Urology

## 2023-02-07 DIAGNOSIS — C61 Malignant neoplasm of prostate: Secondary | ICD-10-CM

## 2023-02-07 DIAGNOSIS — M1990 Unspecified osteoarthritis, unspecified site: Secondary | ICD-10-CM | POA: Diagnosis not present

## 2023-02-07 DIAGNOSIS — I2510 Atherosclerotic heart disease of native coronary artery without angina pectoris: Secondary | ICD-10-CM | POA: Insufficient documentation

## 2023-02-07 DIAGNOSIS — E041 Nontoxic single thyroid nodule: Secondary | ICD-10-CM | POA: Diagnosis not present

## 2023-02-07 DIAGNOSIS — Z79818 Long term (current) use of other agents affecting estrogen receptors and estrogen levels: Secondary | ICD-10-CM | POA: Diagnosis not present

## 2023-02-07 DIAGNOSIS — Z01818 Encounter for other preprocedural examination: Secondary | ICD-10-CM

## 2023-02-07 HISTORY — DX: Personal history of urinary calculi: Z87.442

## 2023-02-07 HISTORY — DX: Nontoxic single thyroid nodule: E04.1

## 2023-02-07 HISTORY — DX: Presence of spectacles and contact lenses: Z97.3

## 2023-02-07 HISTORY — PX: SPACE OAR INSTILLATION: SHX6769

## 2023-02-07 HISTORY — PX: GOLD SEED IMPLANT: SHX6343

## 2023-02-07 HISTORY — DX: Unspecified osteoarthritis, unspecified site: M19.90

## 2023-02-07 SURGERY — INSERTION, GOLD SEEDS
Anesthesia: Monitor Anesthesia Care | Site: Prostate

## 2023-02-07 MED ORDER — SODIUM CHLORIDE (PF) 0.9 % IJ SOLN
INTRAMUSCULAR | Status: DC | PRN
  Administered 2023-02-07: 10 mL

## 2023-02-07 MED ORDER — DEXMEDETOMIDINE HCL IN NACL 200 MCG/50ML IV SOLN
INTRAVENOUS | Status: DC | PRN
  Administered 2023-02-07 (×2): 4 ug via INTRAVENOUS

## 2023-02-07 MED ORDER — ONDANSETRON HCL 4 MG/2ML IJ SOLN: 4.0000 mg | Freq: Four times a day (QID) | INTRAMUSCULAR | Status: DC | PRN

## 2023-02-07 MED ORDER — PROPOFOL 500 MG/50ML IV EMUL
INTRAVENOUS | Status: DC | PRN
  Administered 2023-02-07: 200 ug/kg/min via INTRAVENOUS

## 2023-02-07 MED ORDER — OXYCODONE HCL 5 MG/5ML PO SOLN: 5.0000 mg | Freq: Once | ORAL | Status: DC | PRN

## 2023-02-07 MED ORDER — LACTATED RINGERS IV SOLN: INTRAVENOUS | Status: DC

## 2023-02-07 MED ORDER — CIPROFLOXACIN IN D5W 400 MG/200ML IV SOLN
INTRAVENOUS | Status: AC
  Filled 2023-02-07: qty 200

## 2023-02-07 MED ORDER — FENTANYL CITRATE (PF) 100 MCG/2ML IJ SOLN: 25.0000 ug | INTRAMUSCULAR | Status: DC | PRN

## 2023-02-07 MED ORDER — CIPROFLOXACIN IN D5W 400 MG/200ML IV SOLN
400.0000 mg | Freq: Two times a day (BID) | INTRAVENOUS | Status: DC
  Administered 2023-02-07: 400 mg via INTRAVENOUS

## 2023-02-07 MED ORDER — FENTANYL CITRATE (PF) 100 MCG/2ML IJ SOLN
INTRAMUSCULAR | Status: AC
  Filled 2023-02-07: qty 2

## 2023-02-07 MED ORDER — PROPOFOL 500 MG/50ML IV EMUL
INTRAVENOUS | Status: AC
  Filled 2023-02-07: qty 50

## 2023-02-07 MED ORDER — FENTANYL CITRATE (PF) 100 MCG/2ML IJ SOLN
INTRAMUSCULAR | Status: DC | PRN
  Administered 2023-02-07: 50 ug via INTRAVENOUS

## 2023-02-07 MED ORDER — OXYCODONE HCL 5 MG PO TABS: 5.0000 mg | ORAL_TABLET | Freq: Once | ORAL | Status: DC | PRN

## 2023-02-07 MED ORDER — LIDOCAINE HCL (PF) 1 % IJ SOLN
INTRAMUSCULAR | Status: DC | PRN
  Administered 2023-02-07: 10 mL

## 2023-02-07 SURGICAL SUPPLY — 26 items
BLADE CLIPPER SENSICLIP SURGIC (BLADE) ×1 IMPLANT
CNTNR URN SCR LID CUP LEK RST (MISCELLANEOUS) ×1 IMPLANT
CONT SPEC 4OZ STRL OR WHT (MISCELLANEOUS) ×1
COVER BACK TABLE 60X90IN (DRAPES) ×1 IMPLANT
DRSG TEGADERM 4X4.75 (GAUZE/BANDAGES/DRESSINGS) ×1 IMPLANT
DRSG TEGADERM 8X12 (GAUZE/BANDAGES/DRESSINGS) ×1 IMPLANT
GAUZE SPONGE 4X4 12PLY STRL (GAUZE/BANDAGES/DRESSINGS) ×1 IMPLANT
GLOVE BIO SURGEON STRL SZ7.5 (GLOVE) ×1 IMPLANT
GLOVE BIO SURGEON STRL SZ8 (GLOVE) IMPLANT
GLOVE SURG ORTHO 8.5 STRL (GLOVE) ×1 IMPLANT
IMPL SPACEOAR VUE SYSTEM (Spacer) ×1 IMPLANT
IMPLANT SPACEOAR VUE SYSTEM (Spacer) ×1 IMPLANT
KIT TURNOVER CYSTO (KITS) ×1 IMPLANT
MARKER GOLD PRELOAD 1.2X3 (Urological Implant) ×1 IMPLANT
MARKER SKIN DUAL TIP RULER LAB (MISCELLANEOUS) ×1 IMPLANT
NDL SPNL 22GX3.5 QUINCKE BK (NEEDLE) ×1 IMPLANT
NEEDLE SPNL 22GX3.5 QUINCKE BK (NEEDLE) ×1 IMPLANT
SEED GOLD PRELOAD 1.2X3 (Urological Implant) ×1 IMPLANT
SHEATH ULTRASOUND LF (SHEATH) IMPLANT
SHEATH ULTRASOUND LTX NONSTRL (SHEATH) IMPLANT
SLEEVE SCD COMPRESS KNEE MED (STOCKING) ×1 IMPLANT
SURGILUBE 2OZ TUBE FLIPTOP (MISCELLANEOUS) ×1 IMPLANT
SYR 10ML LL (SYRINGE) IMPLANT
SYR CONTROL 10ML LL (SYRINGE) ×1 IMPLANT
TOWEL OR 17X24 6PK STRL BLUE (TOWEL DISPOSABLE) ×1 IMPLANT
UNDERPAD 30X36 HEAVY ABSORB (UNDERPADS AND DIAPERS) ×1 IMPLANT

## 2023-02-07 NOTE — Telephone Encounter (Signed)
Called patient to remind of sim appt. for 02-09-23- arrival time- 10:45 am @ [facility], informed patient to arrive with a full bladder, lvm for a return call

## 2023-02-07 NOTE — Transfer of Care (Signed)
Immediate Anesthesia Transfer of Care Note  Patient: Casey Mullen  Procedure(s) Performed: GOLD SEED IMPLANT (Prostate) SPACE OAR INSTILLATION (Prostate)  Patient Location: PACU  Anesthesia Type:MAC  Level of Consciousness: awake, alert , and oriented  Airway & Oxygen Therapy: Patient Spontanous Breathing and Patient connected to face mask oxygen  Post-op Assessment: Report given to RN and Post -op Vital signs reviewed and stable  Post vital signs: Reviewed and stable  Last Vitals:  Vitals Value Taken Time  BP 99/71 02/07/23 0936  Temp    Pulse 56 02/07/23 0937  Resp 14 02/07/23 0937  SpO2 97 % 02/07/23 0937  Vitals shown include unvalidated device data.  Last Pain:  Vitals:   02/07/23 0648  TempSrc: Oral  PainSc: 0-No pain      Patients Stated Pain Goal: 5 (02/07/23 1610)  Complications: No notable events documented.

## 2023-02-07 NOTE — Anesthesia Preprocedure Evaluation (Signed)
Anesthesia Evaluation  Patient identified by MRN, date of birth, ID band Patient awake    Reviewed: Allergy & Precautions, H&P , NPO status , Patient's Chart, lab work & pertinent test results  Airway Mallampati: II   Neck ROM: full    Dental   Pulmonary neg pulmonary ROS   breath sounds clear to auscultation       Cardiovascular + CAD   Rhythm:regular Rate:Normal     Neuro/Psych    GI/Hepatic ,GERD  ,,  Endo/Other    Renal/GU    Prostate CA    Musculoskeletal  (+) Arthritis ,    Abdominal   Peds  Hematology   Anesthesia Other Findings   Reproductive/Obstetrics                             Anesthesia Physical Anesthesia Plan  ASA: 2  Anesthesia Plan: MAC   Post-op Pain Management:    Induction: Intravenous  PONV Risk Score and Plan: 1 and Propofol infusion, Treatment may vary due to age or medical condition and Ondansetron  Airway Management Planned: Simple Face Mask  Additional Equipment:   Intra-op Plan:   Post-operative Plan:   Informed Consent: I have reviewed the patients History and Physical, chart, labs and discussed the procedure including the risks, benefits and alternatives for the proposed anesthesia with the patient or authorized representative who has indicated his/her understanding and acceptance.     Dental advisory given  Plan Discussed with: CRNA, Anesthesiologist and Surgeon  Anesthesia Plan Comments:        Anesthesia Quick Evaluation

## 2023-02-07 NOTE — Op Note (Signed)
Preoperative diagnosis: Prostate cancer Postoperative diagnosis: Prostate cancer  Procedure: Transrectal ultrasound-guided transperineal placement of gold seed prostate fiducial markers and SpaceOAR biodegradable gel  Surgeon: Mena Goes  Anesthesia: General  Indication for procedure: 72 yo male who is preparing to start external beam radiation.  He presents today for the above.  Findings: Normal-appearing prostate with intact capsule and no hypoechoic. Seminal vesicles appeared normal. Bladder appeared normal.   Description of procedure: After consent was obtained patient brought to the operating room.  After adequate anesthesia he was placed in lithotomy position and the scrotum supported superiorly.  The perineum was prepped.  Ultrasound probe inserted rectally.  Prostate imaged in the axial and sagittal views.  The gold seed markers were passed transperineally under ultrasound guidance placing three total with one in the right base, right apex and left mid.  The 18-gauge needle was then inserted approximately 1 to 2 cm anterior to the anal opening and directed under ultrasonic guidance into the perirectal fat between the anterior rectal wall and the prostate capsule down to the mid-gland. Midline needle position was confirmed in the sagittal and axial views to verify the tip was in the perirectal fat.  Small amounts of saline were injected to hydrodissect the space between the prostate and the anterior rectal wall.  Axial imaging was viewed to confirm the needle was in the correct location in the mid gland and centered.  Aspiration confirmed no intravascular access.  The saline syringe was carefully disconnected maintaining the desired needle position and the hydrogel was attached to the needle.  Under ultrasound guidance in the sagittal view a smooth continuous injection was done over about 12 seconds delivering the hydrogel into the space between the prostate and rectal wall.  The needle was  withdrawn. Gentle rectal exam revealed intact rectal mucosa and no blood on gloved finger.   A dressing was placed and the patient was awakened and taken to the cover room in stable condition.  Complications: None  Blood loss: Minimal  Specimens: None  Drains: None  Disposition: Patient stable to PACU

## 2023-02-07 NOTE — Discharge Instructions (Addendum)
Transrectal Ultrasound-Guided Prostate Gold Seed and Biodegradable Gel Placement, Care After  The following information offers guidance on how to care for yourself after your procedure. Your health care provider may also give you more specific instructions. If you have problems or questions, contact your health care provider. What can I expect after the procedure? After the procedure, it is common to have: Light bleeding from the rectum. Bruising or tenderness in the area behind the scrotum (perineum), if the needle was put into your prostate through this area. Small amounts of blood in your urine. This should only last for a few days. Light brown or red semen. This may last for a couple of weeks. Follow these instructions at home: Medicines A prescription pill bottle with an example of a pill.  Take over-the-counter and prescription medicines only as told by your health care provider. If you were prescribed an antibiotic, take it as told by your health care provider. Do not stop taking the antibiotic early, even if you start to feel better. Ask your health care provider if the medicine prescribed to you requires you to avoid driving or using machinery. Eating and drinking Follow instructions from your health care provider about eating or drinking restrictions. Drink enough fluid to keep your urine pale yellow. Managing pain and swelling If directed, put ice on the affected area. To do this: Put ice in a plastic bag. Place a towel between your skin and the bag. Leave the ice on for 20 minutes, 2-3 times a day. Be sure to remove the ice if your skin turns bright red. If you cannot feel pain, heat, or cold, you have a greater risk of damage to the area. Try not to sit directly on the area behind the scrotum. A soft cushion can help with discomfort. Activity If you were given a sedative during the procedure, it can affect you for several hours. Do not drive or operate machinery until your  health care provider says that it is safe. Return to your normal activities as told by your health care provider. Ask your health care provider what activities are safe for you. Follow instructions from your health care provider about when it is safe for you to engage in sexual activity. General instructions Plan to have a responsible adult care for you for the time you are told after you leave the hospital or clinic. Do not take baths, swim, or use a hot tub until your health care provider approves. Ask your health care provider if you may take showers. You may only be allowed to take sponge baths. Keep all follow-up visits. Contact a health care provider if: You have a fever or chills. You have more blood in your urine. You have blood in your urine for more than 2-3 days after the procedure. You have trouble passing urine or having a bowel movement. You have pain or burning when urinating. You have nausea or you vomit. Get help right away if: You have severe pain that does not get better with medicine. Your urine is bright red. You cannot urinate. You have rectal bleeding that gets worse. You have shortness of breath. Summary After the procedure, you may have blood in your urine and light bleeding from the rectum. Return to your normal activities as told by your health care provider. Ask your health care provider what activities are safe for you. Take over-the-counter and prescription medicines only as told by your health care provider. Contact your health care provider right away if   your urine is bright red or you cannot pass urine. This information is not intended to replace advice given to you by your health care provider. Make sure you discuss any questions you have with your health care provider. Document Revised: 11/18/2020 Document Reviewed: 11/18/2020 Elsevier Patient Education  2023 [employer]        Post Anesthesia Home Care Instructions  Activity: Get plenty of  rest for the remainder of the day. A responsible individual must stay with you for 24 hours following the procedure.  For the next 24 hours, DO NOT: -Drive a car -Operate machinery -Drink alcoholic beverages -Take any medication unless instructed by your physician -Make any legal decisions or sign important papers.  Meals: Start with liquid foods such as gelatin or soup. Progress to regular foods as tolerated. Avoid greasy, spicy, heavy foods. If nausea and/or vomiting occur, drink only clear liquids until the nausea and/or vomiting subsides. Call your physician if vomiting continues.  Special Instructions/Symptoms: Your throat may feel dry or sore from the anesthesia or the breathing tube placed in your throat during surgery. If this causes discomfort, gargle with warm salt water. The discomfort should disappear within 24 hours.     

## 2023-02-07 NOTE — Interval H&P Note (Signed)
History and Physical Interval Note:  02/07/2023 8:57 AM  Casey Mullen  has presented today for surgery, with the diagnosis of PROSTATE CANCER.  The various methods of treatment have been discussed with the patient and family. After consideration of risks, benefits and other options for treatment, the patient has consented to  Procedure(s): GOLD SEED IMPLANT (N/A) SPACE OAR INSTILLATION (N/A) as a surgical intervention.  The patient's history has been reviewed, patient examined, no change in status, stable for surgery.  I have reviewed the patient's chart and labs.  I drew the patient and his wife a picture of the anatomy and we discussed risk of urethral and rectal injury among others.  They also ask about seed migration.  We discussed its possibility but lower risk than brachytherapy boost (one of the reasons they did not do the boost) and that the seeds were inert.  Otherwise he has been well without cough cold or congestion.  No fever.  No dysuria or gross hematuria.  All questions were answered to the patient's satisfaction.  He elects to proceed.   Jerilee Field

## 2023-02-08 ENCOUNTER — Encounter (HOSPITAL_BASED_OUTPATIENT_CLINIC_OR_DEPARTMENT_OTHER): Payer: Self-pay | Admitting: Urology

## 2023-02-08 NOTE — Anesthesia Postprocedure Evaluation (Signed)
Anesthesia Post Note  Patient: Casey Mullen  Procedure(s) Performed: GOLD SEED IMPLANT (Prostate) SPACE OAR INSTILLATION (Prostate)     Patient location during evaluation: PACU Anesthesia Type: MAC Level of consciousness: awake and alert Pain management: pain level controlled Vital Signs Assessment: post-procedure vital signs reviewed and stable Respiratory status: spontaneous breathing, nonlabored ventilation, respiratory function stable and patient connected to nasal cannula oxygen Cardiovascular status: stable and blood pressure returned to baseline Postop Assessment: no apparent nausea or vomiting Anesthetic complications: no   No notable events documented.  Last Vitals:  Vitals:   02/07/23 1000 02/07/23 1026  BP:  139/83  Pulse:  (!) 50  Resp:  16  Temp: (!) 36.2 C (!) 36.3 C  SpO2:  96%    Last Pain:  Vitals:   02/07/23 1026  TempSrc:   PainSc: 0-No pain                 Hagan Vanauken S

## 2023-02-08 NOTE — Progress Notes (Signed)
  Radiation Oncology         [phone] ________________________________  Name: Casey Mullen MRN: [id number]  Date: 02/09/2023  DOB: October 15, 1950  SIMULATION AND TREATMENT PLANNING NOTE    ICD-10-CM   1. Malignant neoplasm of prostate (HCC)  C61       DIAGNOSIS:  72 y.o. gentleman with Stage T1c adenocarcinoma of the prostate with Gleason score of 4+5, and PSA of 4.46.   NARRATIVE:  The patient was brought to the CT Simulation planning suite.  Identity was confirmed.  All relevant records and images related to the planned course of therapy were reviewed.  The patient freely provided informed written consent to proceed with treatment after reviewing the details related to the planned course of therapy. The consent form was witnessed and verified by the simulation staff.  Then, the patient was set-up in a stable reproducible supine position for radiation therapy.  A vacuum lock pillow device was custom fabricated to position his legs in a reproducible immobilized position.  Then, I performed a urethrogram under sterile conditions to identify the prostatic bed.  CT images were obtained.  Surface markings were placed.  The CT images were loaded into the planning software.  Then the prostate bed target, pelvic lymph node target and avoidance structures including the rectum, bladder, bowel and hips were contoured.  Treatment planning then occurred.  The radiation prescription was entered and confirmed.  A total of one complex treatment devices were fabricated. I have requested : Intensity Modulated Radiotherapy (IMRT) is medically necessary for this case for the following reason:  Rectal sparing.Marland Kitchen  PLAN:  The patient will receive 45 Gy in 25 fractions of 1.8 Gy, followed by a boost to the prostate to a total dose of 75 Gy with 15 additional fractions of 2 Gy.   ________________________________  Artist Pais Kathrynn Running, M.D.

## 2023-02-09 ENCOUNTER — Other Ambulatory Visit: Payer: Self-pay

## 2023-02-09 ENCOUNTER — Ambulatory Visit
Admission: RE | Admit: 2023-02-09 | Discharge: 2023-02-09 | Disposition: A | Discharge status: Home / Self Care | Payer: Medicare Other | Source: Ambulatory Visit | Attending: Radiation Oncology | Admitting: Radiation Oncology

## 2023-02-09 DIAGNOSIS — Z191 Hormone sensitive malignancy status: Secondary | ICD-10-CM | POA: Diagnosis not present

## 2023-02-09 DIAGNOSIS — Z510 Encounter for antineoplastic radiation therapy: Secondary | ICD-10-CM | POA: Insufficient documentation

## 2023-02-09 DIAGNOSIS — C61 Malignant neoplasm of prostate: Secondary | ICD-10-CM | POA: Insufficient documentation

## 2023-02-13 DIAGNOSIS — Z510 Encounter for antineoplastic radiation therapy: Secondary | ICD-10-CM | POA: Diagnosis not present

## 2023-02-13 DIAGNOSIS — Z191 Hormone sensitive malignancy status: Secondary | ICD-10-CM | POA: Diagnosis not present

## 2023-02-23 ENCOUNTER — Other Ambulatory Visit: Payer: Self-pay

## 2023-02-23 ENCOUNTER — Ambulatory Visit
Admission: RE | Admit: 2023-02-23 | Discharge: 2023-02-23 | Disposition: A | Discharge status: Home / Self Care | Payer: Medicare Other | Source: Ambulatory Visit | Attending: Radiation Oncology | Admitting: Radiation Oncology

## 2023-02-23 DIAGNOSIS — Z191 Hormone sensitive malignancy status: Secondary | ICD-10-CM | POA: Diagnosis not present

## 2023-02-23 DIAGNOSIS — Z510 Encounter for antineoplastic radiation therapy: Secondary | ICD-10-CM | POA: Diagnosis not present

## 2023-02-23 LAB — RAD ONC ARIA SESSION SUMMARY
Course Elapsed Days: 0
Plan Fractions Treated to Date: 1
Plan Prescribed Dose Per Fraction: 1.8 Gy
Plan Total Fractions Prescribed: 25
Plan Total Prescribed Dose: 45 Gy
Reference Point Dosage Given to Date: 1.8 Gy
Reference Point Session Dosage Given: 1.8 Gy
Session Number: 1

## 2023-02-24 ENCOUNTER — Ambulatory Visit
Admission: RE | Admit: 2023-02-24 | Discharge: 2023-02-24 | Disposition: A | Discharge status: Home / Self Care | Payer: Medicare Other | Source: Ambulatory Visit | Attending: Radiation Oncology | Admitting: Radiation Oncology

## 2023-02-24 ENCOUNTER — Other Ambulatory Visit: Payer: Self-pay

## 2023-02-24 DIAGNOSIS — Z510 Encounter for antineoplastic radiation therapy: Secondary | ICD-10-CM | POA: Diagnosis not present

## 2023-02-24 DIAGNOSIS — Z191 Hormone sensitive malignancy status: Secondary | ICD-10-CM | POA: Diagnosis not present

## 2023-02-24 LAB — RAD ONC ARIA SESSION SUMMARY
Course Elapsed Days: 1
Plan Fractions Treated to Date: 2
Plan Prescribed Dose Per Fraction: 1.8 Gy
Plan Total Fractions Prescribed: 25
Plan Total Prescribed Dose: 45 Gy
Reference Point Dosage Given to Date: 3.6 Gy
Reference Point Session Dosage Given: 1.8 Gy
Session Number: 2

## 2023-02-27 ENCOUNTER — Ambulatory Visit
Admission: RE | Admit: 2023-02-27 | Discharge: 2023-02-27 | Disposition: A | Discharge status: Home / Self Care | Payer: Medicare Other | Source: Ambulatory Visit | Attending: Radiation Oncology | Admitting: Radiation Oncology

## 2023-02-27 ENCOUNTER — Other Ambulatory Visit: Payer: Self-pay

## 2023-02-27 DIAGNOSIS — Z510 Encounter for antineoplastic radiation therapy: Secondary | ICD-10-CM | POA: Diagnosis not present

## 2023-02-27 DIAGNOSIS — Z191 Hormone sensitive malignancy status: Secondary | ICD-10-CM | POA: Diagnosis not present

## 2023-02-27 LAB — RAD ONC ARIA SESSION SUMMARY
Course Elapsed Days: 4
Plan Fractions Treated to Date: 3
Plan Prescribed Dose Per Fraction: 1.8 Gy
Plan Total Fractions Prescribed: 25
Plan Total Prescribed Dose: 45 Gy
Reference Point Dosage Given to Date: 5.4 Gy
Reference Point Session Dosage Given: 1.8 Gy
Session Number: 3

## 2023-02-28 ENCOUNTER — Ambulatory Visit
Admission: RE | Admit: 2023-02-28 | Discharge: 2023-02-28 | Disposition: A | Discharge status: Home / Self Care | Payer: Medicare Other | Source: Ambulatory Visit | Attending: Radiation Oncology | Admitting: Radiation Oncology

## 2023-02-28 ENCOUNTER — Other Ambulatory Visit: Payer: Self-pay

## 2023-02-28 DIAGNOSIS — Z191 Hormone sensitive malignancy status: Secondary | ICD-10-CM | POA: Diagnosis not present

## 2023-02-28 DIAGNOSIS — Z510 Encounter for antineoplastic radiation therapy: Secondary | ICD-10-CM | POA: Diagnosis not present

## 2023-02-28 LAB — RAD ONC ARIA SESSION SUMMARY
Course Elapsed Days: 5
Plan Fractions Treated to Date: 4
Plan Prescribed Dose Per Fraction: 1.8 Gy
Plan Total Fractions Prescribed: 25
Plan Total Prescribed Dose: 45 Gy
Reference Point Dosage Given to Date: 7.2 Gy
Reference Point Session Dosage Given: 1.8 Gy
Session Number: 4

## 2023-03-01 ENCOUNTER — Other Ambulatory Visit: Payer: Self-pay

## 2023-03-01 ENCOUNTER — Ambulatory Visit
Admission: RE | Admit: 2023-03-01 | Discharge: 2023-03-01 | Disposition: A | Discharge status: Home / Self Care | Payer: Medicare Other | Source: Ambulatory Visit | Attending: Radiation Oncology | Admitting: Radiation Oncology

## 2023-03-01 DIAGNOSIS — Z510 Encounter for antineoplastic radiation therapy: Secondary | ICD-10-CM | POA: Diagnosis not present

## 2023-03-01 DIAGNOSIS — Z191 Hormone sensitive malignancy status: Secondary | ICD-10-CM | POA: Diagnosis not present

## 2023-03-01 LAB — RAD ONC ARIA SESSION SUMMARY
Course Elapsed Days: 6
Plan Fractions Treated to Date: 5
Plan Prescribed Dose Per Fraction: 1.8 Gy
Plan Total Fractions Prescribed: 25
Plan Total Prescribed Dose: 45 Gy
Reference Point Dosage Given to Date: 9 Gy
Reference Point Session Dosage Given: 1.8 Gy
Session Number: 5

## 2023-03-02 ENCOUNTER — Ambulatory Visit
Admission: RE | Admit: 2023-03-02 | Discharge: 2023-03-02 | Disposition: A | Discharge status: Home / Self Care | Payer: Medicare Other | Source: Ambulatory Visit | Attending: Radiation Oncology | Admitting: Radiation Oncology

## 2023-03-02 ENCOUNTER — Other Ambulatory Visit: Payer: Self-pay

## 2023-03-02 DIAGNOSIS — Z191 Hormone sensitive malignancy status: Secondary | ICD-10-CM | POA: Diagnosis not present

## 2023-03-02 DIAGNOSIS — Z510 Encounter for antineoplastic radiation therapy: Secondary | ICD-10-CM | POA: Diagnosis not present

## 2023-03-02 LAB — RAD ONC ARIA SESSION SUMMARY
Course Elapsed Days: 7
Plan Fractions Treated to Date: 6
Plan Prescribed Dose Per Fraction: 1.8 Gy
Plan Total Fractions Prescribed: 25
Plan Total Prescribed Dose: 45 Gy
Reference Point Dosage Given to Date: 10.8 Gy
Reference Point Session Dosage Given: 1.8 Gy
Session Number: 6

## 2023-03-03 ENCOUNTER — Other Ambulatory Visit: Payer: Self-pay

## 2023-03-03 ENCOUNTER — Ambulatory Visit
Admission: RE | Admit: 2023-03-03 | Discharge: 2023-03-03 | Disposition: A | Discharge status: Home / Self Care | Payer: Medicare Other | Source: Ambulatory Visit | Attending: Radiation Oncology | Admitting: Radiation Oncology

## 2023-03-03 DIAGNOSIS — Z191 Hormone sensitive malignancy status: Secondary | ICD-10-CM | POA: Diagnosis not present

## 2023-03-03 DIAGNOSIS — Z510 Encounter for antineoplastic radiation therapy: Secondary | ICD-10-CM | POA: Diagnosis not present

## 2023-03-03 LAB — RAD ONC ARIA SESSION SUMMARY
Course Elapsed Days: 8
Plan Fractions Treated to Date: 7
Plan Prescribed Dose Per Fraction: 1.8 Gy
Plan Total Fractions Prescribed: 25
Plan Total Prescribed Dose: 45 Gy
Reference Point Dosage Given to Date: 12.6 Gy
Reference Point Session Dosage Given: 1.8 Gy
Session Number: 7

## 2023-03-06 ENCOUNTER — Ambulatory Visit
Admission: RE | Admit: 2023-03-06 | Discharge: 2023-03-06 | Disposition: A | Discharge status: Home / Self Care | Payer: Medicare Other | Source: Ambulatory Visit | Attending: Radiation Oncology | Admitting: Radiation Oncology

## 2023-03-06 ENCOUNTER — Other Ambulatory Visit: Payer: Self-pay

## 2023-03-06 DIAGNOSIS — Z510 Encounter for antineoplastic radiation therapy: Secondary | ICD-10-CM | POA: Diagnosis not present

## 2023-03-06 DIAGNOSIS — C61 Malignant neoplasm of prostate: Secondary | ICD-10-CM | POA: Diagnosis not present

## 2023-03-06 DIAGNOSIS — Z191 Hormone sensitive malignancy status: Secondary | ICD-10-CM | POA: Diagnosis not present

## 2023-03-06 LAB — RAD ONC ARIA SESSION SUMMARY
Course Elapsed Days: 11
Plan Fractions Treated to Date: 8
Plan Prescribed Dose Per Fraction: 1.8 Gy
Plan Total Fractions Prescribed: 25
Plan Total Prescribed Dose: 45 Gy
Reference Point Dosage Given to Date: 14.4 Gy
Reference Point Session Dosage Given: 1.8 Gy
Session Number: 8

## 2023-03-07 ENCOUNTER — Other Ambulatory Visit: Payer: Self-pay

## 2023-03-07 ENCOUNTER — Ambulatory Visit
Admission: RE | Admit: 2023-03-07 | Discharge: 2023-03-07 | Disposition: A | Discharge status: Home / Self Care | Payer: Medicare Other | Source: Ambulatory Visit | Attending: Radiation Oncology | Admitting: Radiation Oncology

## 2023-03-07 DIAGNOSIS — Z191 Hormone sensitive malignancy status: Secondary | ICD-10-CM | POA: Diagnosis not present

## 2023-03-07 DIAGNOSIS — Z510 Encounter for antineoplastic radiation therapy: Secondary | ICD-10-CM | POA: Diagnosis not present

## 2023-03-07 LAB — RAD ONC ARIA SESSION SUMMARY
Course Elapsed Days: 12
Plan Fractions Treated to Date: 9
Plan Prescribed Dose Per Fraction: 1.8 Gy
Plan Total Fractions Prescribed: 25
Plan Total Prescribed Dose: 45 Gy
Reference Point Dosage Given to Date: 16.2 Gy
Reference Point Session Dosage Given: 1.8 Gy
Session Number: 9

## 2023-03-08 ENCOUNTER — Ambulatory Visit
Admission: RE | Admit: 2023-03-08 | Discharge: 2023-03-08 | Disposition: A | Discharge status: Home / Self Care | Payer: Medicare Other | Source: Ambulatory Visit | Attending: Radiation Oncology | Admitting: Radiation Oncology

## 2023-03-08 ENCOUNTER — Other Ambulatory Visit: Payer: Self-pay

## 2023-03-08 DIAGNOSIS — Z510 Encounter for antineoplastic radiation therapy: Secondary | ICD-10-CM | POA: Diagnosis not present

## 2023-03-08 DIAGNOSIS — Z191 Hormone sensitive malignancy status: Secondary | ICD-10-CM | POA: Diagnosis not present

## 2023-03-08 LAB — RAD ONC ARIA SESSION SUMMARY
Course Elapsed Days: 13
Plan Fractions Treated to Date: 10
Plan Prescribed Dose Per Fraction: 1.8 Gy
Plan Total Fractions Prescribed: 25
Plan Total Prescribed Dose: 45 Gy
Reference Point Dosage Given to Date: 18 Gy
Reference Point Session Dosage Given: 1.8 Gy
Session Number: 10

## 2023-03-10 ENCOUNTER — Ambulatory Visit
Admission: RE | Admit: 2023-03-10 | Discharge: 2023-03-10 | Disposition: A | Discharge status: Home / Self Care | Payer: Medicare Other | Source: Ambulatory Visit | Attending: Radiation Oncology | Admitting: Radiation Oncology

## 2023-03-10 ENCOUNTER — Other Ambulatory Visit: Payer: Self-pay

## 2023-03-10 DIAGNOSIS — Z191 Hormone sensitive malignancy status: Secondary | ICD-10-CM | POA: Diagnosis not present

## 2023-03-10 DIAGNOSIS — Z510 Encounter for antineoplastic radiation therapy: Secondary | ICD-10-CM | POA: Diagnosis not present

## 2023-03-10 LAB — RAD ONC ARIA SESSION SUMMARY
Course Elapsed Days: 15
Plan Fractions Treated to Date: 11
Plan Prescribed Dose Per Fraction: 1.8 Gy
Plan Total Fractions Prescribed: 25
Plan Total Prescribed Dose: 45 Gy
Reference Point Dosage Given to Date: 19.8 Gy
Reference Point Session Dosage Given: 1.8 Gy
Session Number: 11

## 2023-03-13 ENCOUNTER — Ambulatory Visit
Admission: RE | Admit: 2023-03-13 | Discharge: 2023-03-13 | Disposition: A | Discharge status: Home / Self Care | Payer: Medicare Other | Source: Ambulatory Visit | Attending: Radiation Oncology | Admitting: Radiation Oncology

## 2023-03-13 ENCOUNTER — Other Ambulatory Visit: Payer: Self-pay

## 2023-03-13 DIAGNOSIS — Z191 Hormone sensitive malignancy status: Secondary | ICD-10-CM | POA: Diagnosis not present

## 2023-03-13 DIAGNOSIS — Z510 Encounter for antineoplastic radiation therapy: Secondary | ICD-10-CM | POA: Diagnosis not present

## 2023-03-13 LAB — RAD ONC ARIA SESSION SUMMARY
Course Elapsed Days: 18
Plan Fractions Treated to Date: 12
Plan Prescribed Dose Per Fraction: 1.8 Gy
Plan Total Fractions Prescribed: 25
Plan Total Prescribed Dose: 45 Gy
Reference Point Dosage Given to Date: 21.6 Gy
Reference Point Session Dosage Given: 1.8 Gy
Session Number: 12

## 2023-03-14 ENCOUNTER — Other Ambulatory Visit: Payer: Self-pay

## 2023-03-14 ENCOUNTER — Ambulatory Visit
Admission: RE | Admit: 2023-03-14 | Discharge: 2023-03-14 | Disposition: A | Discharge status: Home / Self Care | Payer: Medicare Other | Source: Ambulatory Visit | Attending: Radiation Oncology | Admitting: Radiation Oncology

## 2023-03-14 DIAGNOSIS — Z191 Hormone sensitive malignancy status: Secondary | ICD-10-CM | POA: Diagnosis not present

## 2023-03-14 DIAGNOSIS — Z510 Encounter for antineoplastic radiation therapy: Secondary | ICD-10-CM | POA: Diagnosis not present

## 2023-03-14 LAB — RAD ONC ARIA SESSION SUMMARY
Course Elapsed Days: 19
Plan Fractions Treated to Date: 13
Plan Prescribed Dose Per Fraction: 1.8 Gy
Plan Total Fractions Prescribed: 25
Plan Total Prescribed Dose: 45 Gy
Reference Point Dosage Given to Date: 23.4 Gy
Reference Point Session Dosage Given: 1.8 Gy
Session Number: 13

## 2023-03-15 ENCOUNTER — Other Ambulatory Visit: Payer: Self-pay

## 2023-03-15 ENCOUNTER — Ambulatory Visit
Admission: RE | Admit: 2023-03-15 | Discharge: 2023-03-15 | Disposition: A | Discharge status: Home / Self Care | Payer: Medicare Other | Source: Ambulatory Visit | Attending: Radiation Oncology | Admitting: Radiation Oncology

## 2023-03-15 DIAGNOSIS — Z510 Encounter for antineoplastic radiation therapy: Secondary | ICD-10-CM | POA: Diagnosis not present

## 2023-03-15 DIAGNOSIS — Z191 Hormone sensitive malignancy status: Secondary | ICD-10-CM | POA: Diagnosis not present

## 2023-03-15 LAB — RAD ONC ARIA SESSION SUMMARY
Course Elapsed Days: 20
Plan Fractions Treated to Date: 14
Plan Prescribed Dose Per Fraction: 1.8 Gy
Plan Total Fractions Prescribed: 25
Plan Total Prescribed Dose: 45 Gy
Reference Point Dosage Given to Date: 25.2 Gy
Reference Point Session Dosage Given: 1.8 Gy
Session Number: 14

## 2023-03-16 ENCOUNTER — Ambulatory Visit
Admission: RE | Admit: 2023-03-16 | Discharge: 2023-03-16 | Disposition: A | Discharge status: Home / Self Care | Payer: Medicare Other | Source: Ambulatory Visit | Attending: Radiation Oncology | Admitting: Radiation Oncology

## 2023-03-16 ENCOUNTER — Other Ambulatory Visit: Payer: Self-pay

## 2023-03-16 DIAGNOSIS — Z510 Encounter for antineoplastic radiation therapy: Secondary | ICD-10-CM | POA: Diagnosis not present

## 2023-03-16 DIAGNOSIS — Z191 Hormone sensitive malignancy status: Secondary | ICD-10-CM | POA: Diagnosis not present

## 2023-03-16 LAB — RAD ONC ARIA SESSION SUMMARY
Course Elapsed Days: 21
Plan Fractions Treated to Date: 15
Plan Prescribed Dose Per Fraction: 1.8 Gy
Plan Total Fractions Prescribed: 25
Plan Total Prescribed Dose: 45 Gy
Reference Point Dosage Given to Date: 27 Gy
Reference Point Session Dosage Given: 1.8 Gy
Session Number: 15

## 2023-03-17 ENCOUNTER — Ambulatory Visit
Admission: RE | Admit: 2023-03-17 | Discharge: 2023-03-17 | Disposition: A | Discharge status: Home / Self Care | Payer: Medicare Other | Source: Ambulatory Visit | Attending: Radiation Oncology | Admitting: Radiation Oncology

## 2023-03-17 ENCOUNTER — Other Ambulatory Visit: Payer: Self-pay

## 2023-03-17 DIAGNOSIS — Z191 Hormone sensitive malignancy status: Secondary | ICD-10-CM | POA: Diagnosis not present

## 2023-03-17 DIAGNOSIS — Z510 Encounter for antineoplastic radiation therapy: Secondary | ICD-10-CM | POA: Diagnosis not present

## 2023-03-17 LAB — RAD ONC ARIA SESSION SUMMARY
Course Elapsed Days: 22
Plan Fractions Treated to Date: 16
Plan Prescribed Dose Per Fraction: 1.8 Gy
Plan Total Fractions Prescribed: 25
Plan Total Prescribed Dose: 45 Gy
Reference Point Dosage Given to Date: 28.8 Gy
Reference Point Session Dosage Given: 1.8 Gy
Session Number: 16

## 2023-03-20 ENCOUNTER — Other Ambulatory Visit: Payer: Self-pay

## 2023-03-20 ENCOUNTER — Ambulatory Visit
Admission: RE | Admit: 2023-03-20 | Discharge: 2023-03-20 | Disposition: A | Discharge status: Home / Self Care | Payer: Medicare Other | Source: Ambulatory Visit | Attending: Radiation Oncology | Admitting: Radiation Oncology

## 2023-03-20 DIAGNOSIS — Z510 Encounter for antineoplastic radiation therapy: Secondary | ICD-10-CM | POA: Diagnosis not present

## 2023-03-20 DIAGNOSIS — Z191 Hormone sensitive malignancy status: Secondary | ICD-10-CM | POA: Diagnosis not present

## 2023-03-20 LAB — RAD ONC ARIA SESSION SUMMARY
Course Elapsed Days: 25
Plan Fractions Treated to Date: 17
Plan Prescribed Dose Per Fraction: 1.8 Gy
Plan Total Fractions Prescribed: 25
Plan Total Prescribed Dose: 45 Gy
Reference Point Dosage Given to Date: 30.6 Gy
Reference Point Session Dosage Given: 1.8 Gy
Session Number: 17

## 2023-03-21 ENCOUNTER — Other Ambulatory Visit: Payer: Self-pay

## 2023-03-21 ENCOUNTER — Ambulatory Visit: Admission: RE | Admit: 2023-03-21 | Payer: Medicare Other | Source: Ambulatory Visit

## 2023-03-21 DIAGNOSIS — Z510 Encounter for antineoplastic radiation therapy: Secondary | ICD-10-CM | POA: Diagnosis not present

## 2023-03-21 DIAGNOSIS — Z191 Hormone sensitive malignancy status: Secondary | ICD-10-CM | POA: Diagnosis not present

## 2023-03-21 LAB — RAD ONC ARIA SESSION SUMMARY
Course Elapsed Days: 26
Plan Fractions Treated to Date: 18
Plan Prescribed Dose Per Fraction: 1.8 Gy
Plan Total Fractions Prescribed: 25
Plan Total Prescribed Dose: 45 Gy
Reference Point Dosage Given to Date: 32.4 Gy
Reference Point Session Dosage Given: 1.8 Gy
Session Number: 18

## 2023-03-22 ENCOUNTER — Ambulatory Visit: Payer: Medicare Other

## 2023-03-23 ENCOUNTER — Other Ambulatory Visit: Payer: Self-pay

## 2023-03-23 ENCOUNTER — Ambulatory Visit
Admission: RE | Admit: 2023-03-23 | Discharge: 2023-03-23 | Disposition: A | Discharge status: Home / Self Care | Payer: Medicare Other | Source: Ambulatory Visit | Attending: Radiation Oncology | Admitting: Radiation Oncology

## 2023-03-23 DIAGNOSIS — Z510 Encounter for antineoplastic radiation therapy: Secondary | ICD-10-CM | POA: Diagnosis not present

## 2023-03-23 DIAGNOSIS — Z191 Hormone sensitive malignancy status: Secondary | ICD-10-CM | POA: Diagnosis not present

## 2023-03-23 LAB — RAD ONC ARIA SESSION SUMMARY
Course Elapsed Days: 28
Plan Fractions Treated to Date: 19
Plan Prescribed Dose Per Fraction: 1.8 Gy
Plan Total Fractions Prescribed: 25
Plan Total Prescribed Dose: 45 Gy
Reference Point Dosage Given to Date: 34.2 Gy
Reference Point Session Dosage Given: 1.8 Gy
Session Number: 19

## 2023-03-24 ENCOUNTER — Other Ambulatory Visit: Payer: Self-pay

## 2023-03-24 ENCOUNTER — Ambulatory Visit
Admission: RE | Admit: 2023-03-24 | Discharge: 2023-03-24 | Disposition: A | Discharge status: Home / Self Care | Payer: Medicare Other | Source: Ambulatory Visit | Attending: Radiation Oncology | Admitting: Radiation Oncology

## 2023-03-24 DIAGNOSIS — Z191 Hormone sensitive malignancy status: Secondary | ICD-10-CM | POA: Diagnosis not present

## 2023-03-24 DIAGNOSIS — Z510 Encounter for antineoplastic radiation therapy: Secondary | ICD-10-CM | POA: Diagnosis not present

## 2023-03-24 LAB — RAD ONC ARIA SESSION SUMMARY
Course Elapsed Days: 29
Plan Fractions Treated to Date: 20
Plan Prescribed Dose Per Fraction: 1.8 Gy
Plan Total Fractions Prescribed: 25
Plan Total Prescribed Dose: 45 Gy
Reference Point Dosage Given to Date: 36 Gy
Reference Point Session Dosage Given: 1.8 Gy
Session Number: 20

## 2023-03-27 ENCOUNTER — Other Ambulatory Visit: Payer: Self-pay

## 2023-03-27 ENCOUNTER — Ambulatory Visit: Admission: RE | Admit: 2023-03-27 | Payer: Medicare Other | Source: Ambulatory Visit

## 2023-03-27 DIAGNOSIS — Z510 Encounter for antineoplastic radiation therapy: Secondary | ICD-10-CM | POA: Diagnosis not present

## 2023-03-27 DIAGNOSIS — Z191 Hormone sensitive malignancy status: Secondary | ICD-10-CM | POA: Diagnosis not present

## 2023-03-27 LAB — RAD ONC ARIA SESSION SUMMARY
Course Elapsed Days: 32
Plan Fractions Treated to Date: 21
Plan Prescribed Dose Per Fraction: 1.8 Gy
Plan Total Fractions Prescribed: 25
Plan Total Prescribed Dose: 45 Gy
Reference Point Dosage Given to Date: 37.8 Gy
Reference Point Session Dosage Given: 1.8 Gy
Session Number: 21

## 2023-03-28 ENCOUNTER — Ambulatory Visit: Admission: RE | Admit: 2023-03-28 | Payer: Medicare Other | Source: Ambulatory Visit

## 2023-03-28 ENCOUNTER — Other Ambulatory Visit: Payer: Self-pay

## 2023-03-28 DIAGNOSIS — Z510 Encounter for antineoplastic radiation therapy: Secondary | ICD-10-CM | POA: Diagnosis not present

## 2023-03-28 DIAGNOSIS — Z191 Hormone sensitive malignancy status: Secondary | ICD-10-CM | POA: Diagnosis not present

## 2023-03-28 LAB — RAD ONC ARIA SESSION SUMMARY
Course Elapsed Days: 33
Plan Fractions Treated to Date: 22
Plan Prescribed Dose Per Fraction: 1.8 Gy
Plan Total Fractions Prescribed: 25
Plan Total Prescribed Dose: 45 Gy
Reference Point Dosage Given to Date: 39.6 Gy
Reference Point Session Dosage Given: 1.8 Gy
Session Number: 22

## 2023-03-29 ENCOUNTER — Other Ambulatory Visit: Payer: Self-pay

## 2023-03-29 ENCOUNTER — Ambulatory Visit
Admission: RE | Admit: 2023-03-29 | Discharge: 2023-03-29 | Disposition: A | Discharge status: Home / Self Care | Payer: Medicare Other | Source: Ambulatory Visit | Attending: Radiation Oncology | Admitting: Radiation Oncology

## 2023-03-29 DIAGNOSIS — Z191 Hormone sensitive malignancy status: Secondary | ICD-10-CM | POA: Diagnosis not present

## 2023-03-29 DIAGNOSIS — Z510 Encounter for antineoplastic radiation therapy: Secondary | ICD-10-CM | POA: Diagnosis not present

## 2023-03-29 LAB — RAD ONC ARIA SESSION SUMMARY
Course Elapsed Days: 34
Plan Fractions Treated to Date: 23
Plan Prescribed Dose Per Fraction: 1.8 Gy
Plan Total Fractions Prescribed: 25
Plan Total Prescribed Dose: 45 Gy
Reference Point Dosage Given to Date: 41.4 Gy
Reference Point Session Dosage Given: 1.8 Gy
Session Number: 23

## 2023-03-30 ENCOUNTER — Ambulatory Visit
Admission: RE | Admit: 2023-03-30 | Discharge: 2023-03-30 | Disposition: A | Discharge status: Home / Self Care | Payer: Medicare Other | Source: Ambulatory Visit | Attending: Radiation Oncology | Admitting: Radiation Oncology

## 2023-03-30 ENCOUNTER — Other Ambulatory Visit: Payer: Self-pay

## 2023-03-30 DIAGNOSIS — Z191 Hormone sensitive malignancy status: Secondary | ICD-10-CM | POA: Diagnosis not present

## 2023-03-30 DIAGNOSIS — Z510 Encounter for antineoplastic radiation therapy: Secondary | ICD-10-CM | POA: Diagnosis not present

## 2023-03-30 LAB — RAD ONC ARIA SESSION SUMMARY
Course Elapsed Days: 35
Plan Fractions Treated to Date: 24
Plan Prescribed Dose Per Fraction: 1.8 Gy
Plan Total Fractions Prescribed: 25
Plan Total Prescribed Dose: 45 Gy
Reference Point Dosage Given to Date: 43.2 Gy
Reference Point Session Dosage Given: 1.8 Gy
Session Number: 24

## 2023-03-31 ENCOUNTER — Ambulatory Visit
Admission: RE | Admit: 2023-03-31 | Discharge: 2023-03-31 | Disposition: A | Discharge status: Home / Self Care | Payer: Medicare Other | Source: Ambulatory Visit | Attending: Radiation Oncology | Admitting: Radiation Oncology

## 2023-03-31 ENCOUNTER — Other Ambulatory Visit: Payer: Self-pay

## 2023-03-31 DIAGNOSIS — Z510 Encounter for antineoplastic radiation therapy: Secondary | ICD-10-CM | POA: Diagnosis not present

## 2023-03-31 DIAGNOSIS — Z191 Hormone sensitive malignancy status: Secondary | ICD-10-CM | POA: Diagnosis not present

## 2023-03-31 LAB — RAD ONC ARIA SESSION SUMMARY
Course Elapsed Days: 36
Plan Fractions Treated to Date: 25
Plan Prescribed Dose Per Fraction: 1.8 Gy
Plan Total Fractions Prescribed: 25
Plan Total Prescribed Dose: 45 Gy
Reference Point Dosage Given to Date: 45 Gy
Reference Point Session Dosage Given: 1.8 Gy
Session Number: 25

## 2023-04-03 ENCOUNTER — Ambulatory Visit
Admission: RE | Admit: 2023-04-03 | Discharge: 2023-04-03 | Disposition: A | Discharge status: Home / Self Care | Payer: Medicare Other | Source: Ambulatory Visit | Attending: Radiation Oncology | Admitting: Radiation Oncology

## 2023-04-03 ENCOUNTER — Other Ambulatory Visit: Payer: Self-pay

## 2023-04-03 DIAGNOSIS — Z191 Hormone sensitive malignancy status: Secondary | ICD-10-CM | POA: Diagnosis not present

## 2023-04-03 DIAGNOSIS — Z510 Encounter for antineoplastic radiation therapy: Secondary | ICD-10-CM | POA: Diagnosis not present

## 2023-04-03 LAB — RAD ONC ARIA SESSION SUMMARY
Course Elapsed Days: 39
Plan Fractions Treated to Date: 1
Plan Prescribed Dose Per Fraction: 2 Gy
Plan Total Fractions Prescribed: 15
Plan Total Prescribed Dose: 30 Gy
Reference Point Dosage Given to Date: 2 Gy
Reference Point Session Dosage Given: 2 Gy
Session Number: 26

## 2023-04-04 ENCOUNTER — Other Ambulatory Visit: Payer: Self-pay

## 2023-04-04 ENCOUNTER — Ambulatory Visit
Admission: RE | Admit: 2023-04-04 | Discharge: 2023-04-04 | Disposition: A | Discharge status: Home / Self Care | Payer: Medicare Other | Source: Ambulatory Visit | Attending: Radiation Oncology | Admitting: Radiation Oncology

## 2023-04-04 DIAGNOSIS — Z191 Hormone sensitive malignancy status: Secondary | ICD-10-CM | POA: Diagnosis not present

## 2023-04-04 DIAGNOSIS — Z510 Encounter for antineoplastic radiation therapy: Secondary | ICD-10-CM | POA: Diagnosis not present

## 2023-04-04 LAB — RAD ONC ARIA SESSION SUMMARY
Course Elapsed Days: 40
Plan Fractions Treated to Date: 2
Plan Prescribed Dose Per Fraction: 2 Gy
Plan Total Fractions Prescribed: 15
Plan Total Prescribed Dose: 30 Gy
Reference Point Dosage Given to Date: 4 Gy
Reference Point Session Dosage Given: 2 Gy
Session Number: 27

## 2023-04-05 ENCOUNTER — Other Ambulatory Visit: Payer: Self-pay

## 2023-04-05 ENCOUNTER — Ambulatory Visit
Admission: RE | Admit: 2023-04-05 | Discharge: 2023-04-05 | Disposition: A | Discharge status: Home / Self Care | Payer: Medicare Other | Source: Ambulatory Visit | Attending: Radiation Oncology | Admitting: Radiation Oncology

## 2023-04-05 DIAGNOSIS — Z510 Encounter for antineoplastic radiation therapy: Secondary | ICD-10-CM | POA: Diagnosis not present

## 2023-04-05 DIAGNOSIS — Z191 Hormone sensitive malignancy status: Secondary | ICD-10-CM | POA: Diagnosis not present

## 2023-04-05 LAB — RAD ONC ARIA SESSION SUMMARY
Course Elapsed Days: 41
Plan Fractions Treated to Date: 3
Plan Prescribed Dose Per Fraction: 2 Gy
Plan Total Fractions Prescribed: 15
Plan Total Prescribed Dose: 30 Gy
Reference Point Dosage Given to Date: 6 Gy
Reference Point Session Dosage Given: 2 Gy
Session Number: 28

## 2023-04-06 ENCOUNTER — Other Ambulatory Visit: Payer: Self-pay

## 2023-04-06 ENCOUNTER — Ambulatory Visit
Admission: RE | Admit: 2023-04-06 | Discharge: 2023-04-06 | Disposition: A | Discharge status: Home / Self Care | Payer: Medicare Other | Source: Ambulatory Visit | Attending: Radiation Oncology | Admitting: Radiation Oncology

## 2023-04-06 DIAGNOSIS — Z510 Encounter for antineoplastic radiation therapy: Secondary | ICD-10-CM | POA: Diagnosis not present

## 2023-04-06 DIAGNOSIS — C61 Malignant neoplasm of prostate: Secondary | ICD-10-CM | POA: Diagnosis not present

## 2023-04-06 DIAGNOSIS — Z191 Hormone sensitive malignancy status: Secondary | ICD-10-CM | POA: Diagnosis not present

## 2023-04-06 LAB — RAD ONC ARIA SESSION SUMMARY
Course Elapsed Days: 42
Plan Fractions Treated to Date: 4
Plan Prescribed Dose Per Fraction: 2 Gy
Plan Total Fractions Prescribed: 15
Plan Total Prescribed Dose: 30 Gy
Reference Point Dosage Given to Date: 8 Gy
Reference Point Session Dosage Given: 2 Gy
Session Number: 29

## 2023-04-07 ENCOUNTER — Other Ambulatory Visit: Payer: Self-pay

## 2023-04-07 ENCOUNTER — Ambulatory Visit
Admission: RE | Admit: 2023-04-07 | Discharge: 2023-04-07 | Disposition: A | Discharge status: Home / Self Care | Payer: Medicare Other | Source: Ambulatory Visit | Attending: Radiation Oncology | Admitting: Radiation Oncology

## 2023-04-07 DIAGNOSIS — Z191 Hormone sensitive malignancy status: Secondary | ICD-10-CM | POA: Diagnosis not present

## 2023-04-07 DIAGNOSIS — Z510 Encounter for antineoplastic radiation therapy: Secondary | ICD-10-CM | POA: Diagnosis not present

## 2023-04-07 LAB — RAD ONC ARIA SESSION SUMMARY
Course Elapsed Days: 43
Plan Fractions Treated to Date: 5
Plan Prescribed Dose Per Fraction: 2 Gy
Plan Total Fractions Prescribed: 15
Plan Total Prescribed Dose: 30 Gy
Reference Point Dosage Given to Date: 10 Gy
Reference Point Session Dosage Given: 2 Gy
Session Number: 30

## 2023-04-10 ENCOUNTER — Other Ambulatory Visit: Payer: Self-pay

## 2023-04-10 ENCOUNTER — Ambulatory Visit
Admission: RE | Admit: 2023-04-10 | Discharge: 2023-04-10 | Disposition: A | Discharge status: Home / Self Care | Payer: Medicare Other | Source: Ambulatory Visit | Attending: Radiation Oncology | Admitting: Radiation Oncology

## 2023-04-10 DIAGNOSIS — Z191 Hormone sensitive malignancy status: Secondary | ICD-10-CM | POA: Diagnosis not present

## 2023-04-10 DIAGNOSIS — Z510 Encounter for antineoplastic radiation therapy: Secondary | ICD-10-CM | POA: Diagnosis not present

## 2023-04-10 LAB — RAD ONC ARIA SESSION SUMMARY
Course Elapsed Days: 46
Plan Fractions Treated to Date: 6
Plan Prescribed Dose Per Fraction: 2 Gy
Plan Total Fractions Prescribed: 15
Plan Total Prescribed Dose: 30 Gy
Reference Point Dosage Given to Date: 12 Gy
Reference Point Session Dosage Given: 2 Gy
Session Number: 31

## 2023-04-11 ENCOUNTER — Ambulatory Visit
Admission: RE | Admit: 2023-04-11 | Discharge: 2023-04-11 | Disposition: A | Discharge status: Home / Self Care | Payer: Medicare Other | Source: Ambulatory Visit | Attending: Radiation Oncology | Admitting: Radiation Oncology

## 2023-04-11 ENCOUNTER — Other Ambulatory Visit: Payer: Self-pay

## 2023-04-11 DIAGNOSIS — Z191 Hormone sensitive malignancy status: Secondary | ICD-10-CM | POA: Diagnosis not present

## 2023-04-11 DIAGNOSIS — Z510 Encounter for antineoplastic radiation therapy: Secondary | ICD-10-CM | POA: Diagnosis not present

## 2023-04-11 LAB — RAD ONC ARIA SESSION SUMMARY
Course Elapsed Days: 47
Plan Fractions Treated to Date: 7
Plan Prescribed Dose Per Fraction: 2 Gy
Plan Total Fractions Prescribed: 15
Plan Total Prescribed Dose: 30 Gy
Reference Point Dosage Given to Date: 14 Gy
Reference Point Session Dosage Given: 2 Gy
Session Number: 32

## 2023-04-12 ENCOUNTER — Other Ambulatory Visit: Payer: Self-pay

## 2023-04-12 ENCOUNTER — Ambulatory Visit
Admission: RE | Admit: 2023-04-12 | Discharge: 2023-04-12 | Disposition: A | Discharge status: Home / Self Care | Payer: Medicare Other | Source: Ambulatory Visit | Attending: Radiation Oncology | Admitting: Radiation Oncology

## 2023-04-12 DIAGNOSIS — Z191 Hormone sensitive malignancy status: Secondary | ICD-10-CM | POA: Diagnosis not present

## 2023-04-12 DIAGNOSIS — Z510 Encounter for antineoplastic radiation therapy: Secondary | ICD-10-CM | POA: Diagnosis not present

## 2023-04-12 LAB — RAD ONC ARIA SESSION SUMMARY
Course Elapsed Days: 48
Plan Fractions Treated to Date: 8
Plan Prescribed Dose Per Fraction: 2 Gy
Plan Total Fractions Prescribed: 15
Plan Total Prescribed Dose: 30 Gy
Reference Point Dosage Given to Date: 16 Gy
Reference Point Session Dosage Given: 2 Gy
Session Number: 33

## 2023-04-13 ENCOUNTER — Ambulatory Visit: Payer: Medicare Other

## 2023-04-14 ENCOUNTER — Other Ambulatory Visit: Payer: Self-pay

## 2023-04-14 ENCOUNTER — Ambulatory Visit
Admission: RE | Admit: 2023-04-14 | Discharge: 2023-04-14 | Disposition: A | Discharge status: Home / Self Care | Payer: Medicare Other | Source: Ambulatory Visit | Attending: Radiation Oncology | Admitting: Radiation Oncology

## 2023-04-14 DIAGNOSIS — Z191 Hormone sensitive malignancy status: Secondary | ICD-10-CM | POA: Diagnosis not present

## 2023-04-14 DIAGNOSIS — Z510 Encounter for antineoplastic radiation therapy: Secondary | ICD-10-CM | POA: Diagnosis not present

## 2023-04-14 LAB — RAD ONC ARIA SESSION SUMMARY
Course Elapsed Days: 50
Plan Fractions Treated to Date: 9
Plan Prescribed Dose Per Fraction: 2 Gy
Plan Total Fractions Prescribed: 15
Plan Total Prescribed Dose: 30 Gy
Reference Point Dosage Given to Date: 18 Gy
Reference Point Session Dosage Given: 2 Gy
Session Number: 34

## 2023-04-17 ENCOUNTER — Ambulatory Visit
Admission: RE | Admit: 2023-04-17 | Discharge: 2023-04-17 | Disposition: A | Discharge status: Home / Self Care | Payer: Medicare Other | Source: Ambulatory Visit | Attending: Radiation Oncology | Admitting: Radiation Oncology

## 2023-04-17 ENCOUNTER — Other Ambulatory Visit: Payer: Self-pay

## 2023-04-17 DIAGNOSIS — Z510 Encounter for antineoplastic radiation therapy: Secondary | ICD-10-CM | POA: Diagnosis not present

## 2023-04-17 DIAGNOSIS — Z191 Hormone sensitive malignancy status: Secondary | ICD-10-CM | POA: Diagnosis not present

## 2023-04-17 LAB — RAD ONC ARIA SESSION SUMMARY
Course Elapsed Days: 53
Plan Fractions Treated to Date: 10
Plan Prescribed Dose Per Fraction: 2 Gy
Plan Total Fractions Prescribed: 15
Plan Total Prescribed Dose: 30 Gy
Reference Point Dosage Given to Date: 20 Gy
Reference Point Session Dosage Given: 2 Gy
Session Number: 35

## 2023-04-18 ENCOUNTER — Other Ambulatory Visit: Payer: Self-pay

## 2023-04-18 ENCOUNTER — Ambulatory Visit
Admission: RE | Admit: 2023-04-18 | Discharge: 2023-04-18 | Disposition: A | Discharge status: Home / Self Care | Payer: Medicare Other | Source: Ambulatory Visit | Attending: Radiation Oncology | Admitting: Radiation Oncology

## 2023-04-18 DIAGNOSIS — Z191 Hormone sensitive malignancy status: Secondary | ICD-10-CM | POA: Diagnosis not present

## 2023-04-18 DIAGNOSIS — Z510 Encounter for antineoplastic radiation therapy: Secondary | ICD-10-CM | POA: Diagnosis not present

## 2023-04-18 LAB — RAD ONC ARIA SESSION SUMMARY
Course Elapsed Days: 54
Plan Fractions Treated to Date: 11
Plan Prescribed Dose Per Fraction: 2 Gy
Plan Total Fractions Prescribed: 15
Plan Total Prescribed Dose: 30 Gy
Reference Point Dosage Given to Date: 22 Gy
Reference Point Session Dosage Given: 2 Gy
Session Number: 36

## 2023-04-19 ENCOUNTER — Ambulatory Visit
Admission: RE | Admit: 2023-04-19 | Discharge: 2023-04-19 | Disposition: A | Discharge status: Home / Self Care | Payer: Medicare Other | Source: Ambulatory Visit | Attending: Radiation Oncology | Admitting: Radiation Oncology

## 2023-04-19 ENCOUNTER — Other Ambulatory Visit: Payer: Self-pay

## 2023-04-19 DIAGNOSIS — Z191 Hormone sensitive malignancy status: Secondary | ICD-10-CM | POA: Diagnosis not present

## 2023-04-19 DIAGNOSIS — Z510 Encounter for antineoplastic radiation therapy: Secondary | ICD-10-CM | POA: Diagnosis not present

## 2023-04-19 LAB — RAD ONC ARIA SESSION SUMMARY
Course Elapsed Days: 55
Plan Fractions Treated to Date: 12
Plan Prescribed Dose Per Fraction: 2 Gy
Plan Total Fractions Prescribed: 15
Plan Total Prescribed Dose: 30 Gy
Reference Point Dosage Given to Date: 24 Gy
Reference Point Session Dosage Given: 2 Gy
Session Number: 37

## 2023-04-20 ENCOUNTER — Other Ambulatory Visit: Payer: Self-pay

## 2023-04-20 ENCOUNTER — Ambulatory Visit
Admission: RE | Admit: 2023-04-20 | Discharge: 2023-04-20 | Disposition: A | Discharge status: Home / Self Care | Payer: Medicare Other | Source: Ambulatory Visit | Attending: Radiation Oncology | Admitting: Radiation Oncology

## 2023-04-20 DIAGNOSIS — Z510 Encounter for antineoplastic radiation therapy: Secondary | ICD-10-CM | POA: Diagnosis not present

## 2023-04-20 DIAGNOSIS — Z191 Hormone sensitive malignancy status: Secondary | ICD-10-CM | POA: Diagnosis not present

## 2023-04-20 LAB — RAD ONC ARIA SESSION SUMMARY
Course Elapsed Days: 56
Plan Fractions Treated to Date: 13
Plan Prescribed Dose Per Fraction: 2 Gy
Plan Total Fractions Prescribed: 15
Plan Total Prescribed Dose: 30 Gy
Reference Point Dosage Given to Date: 26 Gy
Reference Point Session Dosage Given: 2 Gy
Session Number: 38

## 2023-04-21 ENCOUNTER — Ambulatory Visit: Admission: RE | Admit: 2023-04-21 | Payer: Medicare Other | Source: Ambulatory Visit

## 2023-04-21 ENCOUNTER — Ambulatory Visit
Admission: RE | Admit: 2023-04-21 | Discharge: 2023-04-21 | Disposition: A | Discharge status: Home / Self Care | Payer: Medicare Other | Source: Ambulatory Visit | Attending: Radiation Oncology | Admitting: Radiation Oncology

## 2023-04-21 ENCOUNTER — Ambulatory Visit: Payer: Medicare Other

## 2023-04-21 ENCOUNTER — Other Ambulatory Visit: Payer: Self-pay

## 2023-04-21 DIAGNOSIS — Z510 Encounter for antineoplastic radiation therapy: Secondary | ICD-10-CM | POA: Diagnosis not present

## 2023-04-21 DIAGNOSIS — Z191 Hormone sensitive malignancy status: Secondary | ICD-10-CM | POA: Diagnosis not present

## 2023-04-21 LAB — RAD ONC ARIA SESSION SUMMARY
Course Elapsed Days: 57
Plan Fractions Treated to Date: 14
Plan Prescribed Dose Per Fraction: 2 Gy
Plan Total Fractions Prescribed: 15
Plan Total Prescribed Dose: 30 Gy
Reference Point Dosage Given to Date: 28 Gy
Reference Point Session Dosage Given: 2 Gy
Session Number: 39

## 2023-04-24 ENCOUNTER — Ambulatory Visit
Admission: RE | Admit: 2023-04-24 | Discharge: 2023-04-24 | Disposition: A | Discharge status: Home / Self Care | Payer: Medicare Other | Source: Ambulatory Visit | Attending: Radiation Oncology | Admitting: Radiation Oncology

## 2023-04-24 ENCOUNTER — Other Ambulatory Visit: Payer: Self-pay

## 2023-04-24 DIAGNOSIS — Z510 Encounter for antineoplastic radiation therapy: Secondary | ICD-10-CM | POA: Diagnosis not present

## 2023-04-24 DIAGNOSIS — Z191 Hormone sensitive malignancy status: Secondary | ICD-10-CM | POA: Diagnosis not present

## 2023-04-24 LAB — RAD ONC ARIA SESSION SUMMARY
Course Elapsed Days: 60
Plan Fractions Treated to Date: 15
Plan Prescribed Dose Per Fraction: 2 Gy
Plan Total Fractions Prescribed: 15
Plan Total Prescribed Dose: 30 Gy
Reference Point Dosage Given to Date: 30 Gy
Reference Point Session Dosage Given: 2 Gy
Session Number: 40

## 2023-04-25 NOTE — Radiation Completion Notes (Addendum)
  Radiation Oncology         [phone] ________________________________  Name: Casey Mullen MRN: [id number]  Date: 04/24/2023  DOB: 18-May-1951  Referring Physician: Jettie Pagan, M.D. Date of Service: 2023-04-25 Radiation Oncologist: Margaretmary Bayley, M.D. [facility]     RADIATION ONCOLOGY END OF TREATMENT NOTE     Diagnosis:  72 y.o. gentleman with Stage T1c adenocarcinoma of the prostate with Gleason score of 4+5, and PSA of 4.46.   Intent: Curative     ==========DELIVERED PLANS==========  First Treatment Date: 2023-02-23 - Last Treatment Date: 2023-04-24   Plan Name: Prostate_Pelv Site: Prostate Technique: IMRT Mode: Photon Dose Per Fraction: 1.8 Gy Prescribed Dose (Delivered / Prescribed): 45 Gy / 45 Gy Prescribed Fxs (Delivered / Prescribed): 25 / 25   Plan Name: Prostate_Bst Site: Prostate Technique: IMRT Mode: Photon Dose Per Fraction: 2 Gy Prescribed Dose (Delivered / Prescribed): 30 Gy / 30 Gy Prescribed Fxs (Delivered / Prescribed): 15 / 15     ==========ON TREATMENT VISIT DATES========== 2023-02-24, 2023-03-03, 2023-03-10, 2023-03-17, 2023-03-24, 2023-03-31, 2023-04-07, 2023-04-14, 2023-04-21   See weekly On Treatment Notes in Epic for details.  He tolerated the radiation treatment relatively well with minor urinary symptoms and modest fatigue.  He did report increased nocturia x 3.  The patient will receive a call in about one month from the radiation oncology department. He will continue follow up with his urologist, Dr. Cardell Peach, as well.  ------------------------------------------------   Margaretmary Dys, MD [facility]  Radiation Oncology Direct Dial: [phone]  Fax: [phone] [email or web address]  Skype  LinkedIn

## 2023-05-03 NOTE — Progress Notes (Signed)
Patient was a RadOnc Consult on 11/18/22 for his stage T1c adenocarcinoma of the prostate with Gleason score of 4+5, and PSA of 4.46 .  Patient proceed with treatment recommendations of LT-ADT and 8 weeks of daily radiation and had his final radiation treatment on 04/24/23.   Patient is scheduled for a post treatment nurse call on 06/06/23 and has his first post treatment PSA on 06/06/23 at [facility].    RN spoke with patient and provided education on post treatment PSA monitoring.  Pt verbalized understanding, no additional needs at this time.

## 2023-05-18 ENCOUNTER — Other Ambulatory Visit: Payer: Self-pay | Admitting: Urology

## 2023-05-18 DIAGNOSIS — C61 Malignant neoplasm of prostate: Secondary | ICD-10-CM

## 2023-05-24 DIAGNOSIS — R931 Abnormal findings on diagnostic imaging of heart and coronary circulation: Secondary | ICD-10-CM | POA: Diagnosis not present

## 2023-05-24 DIAGNOSIS — E785 Hyperlipidemia, unspecified: Secondary | ICD-10-CM | POA: Diagnosis not present

## 2023-05-24 DIAGNOSIS — E041 Nontoxic single thyroid nodule: Secondary | ICD-10-CM | POA: Diagnosis not present

## 2023-05-24 DIAGNOSIS — G4730 Sleep apnea, unspecified: Secondary | ICD-10-CM | POA: Diagnosis not present

## 2023-05-24 DIAGNOSIS — Z0000 Encounter for general adult medical examination without abnormal findings: Secondary | ICD-10-CM | POA: Diagnosis not present

## 2023-05-24 DIAGNOSIS — Z86010 Personal history of colonic polyps: Secondary | ICD-10-CM | POA: Diagnosis not present

## 2023-05-24 DIAGNOSIS — R7301 Impaired fasting glucose: Secondary | ICD-10-CM | POA: Diagnosis not present

## 2023-05-24 DIAGNOSIS — D7589 Other specified diseases of blood and blood-forming organs: Secondary | ICD-10-CM | POA: Diagnosis not present

## 2023-05-31 DIAGNOSIS — Z86010 Personal history of colonic polyps: Secondary | ICD-10-CM | POA: Diagnosis not present

## 2023-06-02 ENCOUNTER — Encounter: Payer: Self-pay | Admitting: *Deleted

## 2023-06-05 ENCOUNTER — Encounter: Payer: Self-pay | Admitting: *Deleted

## 2023-06-06 ENCOUNTER — Ambulatory Visit
Admission: RE | Admit: 2023-06-06 | Discharge: 2023-06-06 | Disposition: A | Discharge status: Home / Self Care | Payer: Medicare Other | Source: Ambulatory Visit | Attending: Radiation Oncology | Admitting: Radiation Oncology

## 2023-06-06 NOTE — Progress Notes (Signed)
Radiation Oncology         [phone] ________________________________  Name: Casey Mullen MRN: [id number]  Date of Service: 06/06/2023  DOB: [birth year]  Post Treatment Telephone Note  Diagnosis:  Stage T1c adenocarcinoma of the prostate with Gleason score of 4+5, and PSA of 4.46. (as documented in provider EOT note)  Pre Treatment IPSS Score: 5 (as documented in the provider consult note)  The patient was available for call today.   Symptoms of fatigue have improved since completing therapy.  Symptoms of bladder changes have improved since completing therapy. Current symptoms include polyuria, and medications for bladder symptoms include none.  Symptoms of bowel changes have improved since completing therapy. Current symptoms include diarrhea, and medications for bowel symptoms include Imodium-AD.   Post Treatment IPSS Score: IPSS Questionnaire (AUA-7): Over the past month.   1)  How often have you had a sensation of not emptying your bladder completely after you finish urinating?  0 - Not at all  2)  How often have you had to urinate again less than two hours after you finished urinating? 4 - More than half the time  3)  How often have you found you stopped and started again several times when you urinated?  0 - Not at all  4) How difficult have you found it to postpone urination?  0 - Not at all  5) How often have you had a weak urinary stream?  0 - Not at all  6) How often have you had to push or strain to begin urination?  0 - Not at all  7) How many times did you most typically get up to urinate from the time you went to bed until the time you got up in the morning?  4 - 4 times  Total score:  8. Which indicates moderate symptoms  0-7 mildly symptomatic   8-19 moderately symptomatic   20-35 severely symptomatic   Patient has a scheduled follow up visit with his urologist, Dr. Cardell Peach, on 06/15/2023 for ongoing surveillance. He was counseled that PSA levels will be drawn in  the urology office, and was reassured that additional time is expected to improve bowel and bladder symptoms. He was encouraged to call back with concerns or questions regarding radiation.   This concludes the interaction.  Ruel Favors, LPN

## 2023-06-22 ENCOUNTER — Encounter: Payer: Self-pay | Admitting: *Deleted

## 2023-06-22 ENCOUNTER — Inpatient Hospital Stay: Payer: Medicare Other | Attending: Adult Health | Admitting: *Deleted

## 2023-06-22 DIAGNOSIS — C61 Malignant neoplasm of prostate: Secondary | ICD-10-CM

## 2023-06-22 NOTE — Progress Notes (Signed)
SCP reviewed and completed. Pt's post -tx PSA was 0.056 in October, 2024. Pt still has nocturia 3-4x  nightly. He also has hotflashes from taking the Orgovyx. Pt is still keeping active playing pickleball daily which he enjoys. Last colonoscopy was 12/2015. Cologuard test done 05/2023.

## 2023-07-09 ENCOUNTER — Encounter (HOSPITAL_BASED_OUTPATIENT_CLINIC_OR_DEPARTMENT_OTHER): Payer: Self-pay | Admitting: *Deleted

## 2023-07-09 ENCOUNTER — Telehealth: Payer: Self-pay | Admitting: Surgery

## 2023-07-09 ENCOUNTER — Emergency Department (HOSPITAL_BASED_OUTPATIENT_CLINIC_OR_DEPARTMENT_OTHER)
Admission: EM | Admit: 2023-07-09 | Discharge: 2023-07-09 | Disposition: A | Discharge status: Home / Self Care | Payer: Medicare Other | Attending: Emergency Medicine | Admitting: Emergency Medicine

## 2023-07-09 ENCOUNTER — Emergency Department (HOSPITAL_BASED_OUTPATIENT_CLINIC_OR_DEPARTMENT_OTHER): Payer: Medicare Other

## 2023-07-09 DIAGNOSIS — C61 Malignant neoplasm of prostate: Secondary | ICD-10-CM | POA: Insufficient documentation

## 2023-07-09 DIAGNOSIS — I82411 Acute embolism and thrombosis of right femoral vein: Secondary | ICD-10-CM | POA: Diagnosis not present

## 2023-07-09 DIAGNOSIS — I82432 Acute embolism and thrombosis of left popliteal vein: Secondary | ICD-10-CM | POA: Diagnosis not present

## 2023-07-09 DIAGNOSIS — Z7901 Long term (current) use of anticoagulants: Secondary | ICD-10-CM | POA: Insufficient documentation

## 2023-07-09 DIAGNOSIS — M7989 Other specified soft tissue disorders: Secondary | ICD-10-CM | POA: Diagnosis not present

## 2023-07-09 DIAGNOSIS — N281 Cyst of kidney, acquired: Secondary | ICD-10-CM | POA: Diagnosis not present

## 2023-07-09 DIAGNOSIS — I82402 Acute embolism and thrombosis of unspecified deep veins of left lower extremity: Secondary | ICD-10-CM | POA: Diagnosis not present

## 2023-07-09 DIAGNOSIS — I82412 Acute embolism and thrombosis of left femoral vein: Secondary | ICD-10-CM | POA: Diagnosis not present

## 2023-07-09 DIAGNOSIS — I700 Atherosclerosis of aorta: Secondary | ICD-10-CM | POA: Diagnosis not present

## 2023-07-09 LAB — CBC
HCT: 37.1 % — ABNORMAL LOW (ref 39.0–52.0)
Hemoglobin: 12.5 g/dL — ABNORMAL LOW (ref 13.0–17.0)
MCH: 33.7 pg (ref 26.0–34.0)
MCHC: 33.7 g/dL (ref 30.0–36.0)
MCV: 100 fL (ref 80.0–100.0)
Platelets: 108 10*3/uL — ABNORMAL LOW (ref 150–400)
RBC: 3.71 MIL/uL — ABNORMAL LOW (ref 4.22–5.81)
RDW: 11.8 % (ref 11.5–15.5)
WBC: 8.1 10*3/uL (ref 4.0–10.5)
nRBC: 0 % (ref 0.0–0.2)

## 2023-07-09 LAB — BASIC METABOLIC PANEL
Anion gap: 8 (ref 5–15)
BUN: 22 mg/dL (ref 8–23)
CO2: 23 mmol/L (ref 22–32)
Calcium: 10.2 mg/dL (ref 8.9–10.3)
Chloride: 103 mmol/L (ref 98–111)
Creatinine, Ser: 0.97 mg/dL (ref 0.61–1.24)
GFR, Estimated: >60 mL/min (ref >60)
Glucose, Bld: 126 mg/dL — ABNORMAL HIGH (ref 70–99)
Potassium: 4.2 mmol/L (ref 3.5–5.1)
Sodium: 134 mmol/L — ABNORMAL LOW (ref 135–145)

## 2023-07-09 MED ORDER — IOHEXOL 350 MG/ML SOLN
125.0000 mL | Freq: Once | INTRAVENOUS | Status: AC | PRN
  Administered 2023-07-09: 125 mL via INTRAVENOUS

## 2023-07-09 MED ORDER — APIXABAN 2.5 MG PO TABS: 5.0000 mg | ORAL_TABLET | Freq: Two times a day (BID) | ORAL | Status: DC

## 2023-07-09 MED ORDER — APIXABAN 5 MG PO TABS: ORAL_TABLET | ORAL | 0 refills | Status: DC

## 2023-07-09 MED ORDER — APIXABAN 2.5 MG PO TABS
10.0000 mg | ORAL_TABLET | Freq: Two times a day (BID) | ORAL | Status: DC
  Administered 2023-07-09: 10 mg via ORAL
  Filled 2023-07-09: qty 4

## 2023-07-09 NOTE — Telephone Encounter (Signed)
Patient went to [facility] with left leg swelling.  He has a history of prostate cancer but no other risk factors for DVT.  Ultrasound showed nonocclusive thrombus within the common femoral vein and distal.  He also had a CT venogram.  There was no ileal caval thrombus.  I have recommended Eliquis and to have him follow-up in my office in a week to see if he has had any symptomatic relief.  If not, he would be considered for mechanical thrombectomy.  Durene Cal

## 2023-07-09 NOTE — ED Notes (Signed)
Pt adds that he was at [place] walk in and told to come here for r/o DVT

## 2023-07-09 NOTE — ED Provider Notes (Signed)
EMERGENCY DEPARTMENT AT [facility] Provider Note   CSN: [id number] Arrival date & time: 07/09/23  4098     History  Chief Complaint  Patient presents with   Leg Swelling    Casey Mullen is a 72 y.o. male currently under good treatment for prostate cancer with , presents with concern for right calf swelling that began yesterday.  States he was going to leave and noticed that his calf is more swollen.  He denies any chest pain, shortness of breath.  Denies any recent periods of immobilization, recent surgeries.   HPI     Home Medications Prior to Admission medications   Medication Sig Start Date End Date Taking? Authorizing Provider  apixaban (ELIQUIS) 5 MG TABS tablet Take 2 tablets (10mg ) twice daily for 7 days, then 1 tablet (5mg ) twice daily 07/09/23  Yes Arabella Merles, PA-C  Ascorbic Acid (VITAMIN C) 1000 MG tablet Take 1,000 mg by mouth daily.    [provider]  Berberine Chloride (BERBERINE HCI PO) Take 1 capsule by mouth daily.    [provider]  Cholecalciferol (VITAMIN D3) 25 MCG (1000 UT) CAPS Take 1 capsule by mouth daily.    [provider]  Glucosamine-Chondroit-Calcium (TRIPLE FLEX BONE & JOINT PO) Take 1 capsule by mouth daily. Patient not taking: Reported on 06/22/2023    [provider]  Red Yeast Rice Extract (RED YEAST RICE PO) Take 1 capsule by mouth 2 (two) times daily.    [provider]  relugolix (ORGOVYX) 120 MG tablet Take 120 mg by mouth daily with lunch.    [provider]  Turmeric 500 MG CAPS Take 1 capsule by mouth 2 (two) times daily.    [provider]      Allergies    Azithromycin and Penicillins    Review of Systems   Review of Systems  Musculoskeletal:        Calf swelling    Physical Exam Updated Vital Signs BP 119/87   Pulse 60   Temp 98.3 F (36.8 C)   Resp 20   Wt 105.7 kg   SpO2 100%   BMI 34.41 kg/m  Physical Exam Vitals and  nursing note reviewed.  Constitutional:      Appearance: Normal appearance.  HENT:     Head: Atraumatic.  Cardiovascular:     Rate and Rhythm: Normal rate and regular rhythm.     Comments: Difficult to palpate the left dorsalis pedis pulse.  Able to obtain this using Doppler  2+ dorsalis pedis pulse of the right foot  Intact cap refill of the right and left lower extremities Pulmonary:     Effort: Pulmonary effort is normal.     Comments: Lungs clear to auscultation bilaterally Musculoskeletal:     Comments: Significant left lower extremity edema involving from the left knee down to the left foot.  Tender to palpation of the left calf   No right lower extremity swelling, no right calf tenderness to palpation  Neurological:     General: No focal deficit present.     Mental Status: He is alert.  Psychiatric:        Mood and Affect: Mood normal.        Behavior: Behavior normal.     ED Results / Procedures / Treatments   Labs (all labs ordered are listed, but only abnormal results are displayed) Labs Reviewed  CBC - Abnormal; Notable for the following components:  Result Value   RBC 3.71 (*)    Hemoglobin 12.5 (*)    HCT 37.1 (*)    Platelets 108 (*)    All other components within normal limits  BASIC METABOLIC PANEL - Abnormal; Notable for the following components:   Sodium 134 (*)    Glucose, Bld 126 (*)    All other components within normal limits    EKG None  Radiology CT VENOGRAM ABD/PELVIS/LOWER EXT BILAT  Result Date: 07/09/2023 CLINICAL DATA:  Extensive left lower extremity DVT. Progressive left leg swelling since yesterday. EXAM: CT VENOGRAM ABDOMEN AND PELVIS AND LOWER EXTREMITY BILATERAL TECHNIQUE: Venographic phase images of the abdomen, pelvis and lower extremities were obtained following the administration of intravenous contrast. Multiplanar reformats and maximum intensity projections were generated. RADIATION DOSE REDUCTION: This exam was performed  according to the departmental dose-optimization program which includes automated exposure control, adjustment of the mA and/or kV according to patient size and/or use of iterative reconstruction technique. CONTRAST:  OMNIPAQUE IOHEXOL 350 MG/ML SOLN COMPARISON:  Left lower extremity Doppler ultrasound same day. PET-CT 11/17/2022. Abdominopelvic CT 01/24/2012. FINDINGS: Lower chest: Mild bibasilar atelectasis. No significant pleural or pericardial effusion aortic and coronary artery atherosclerosis noted. Hepatobiliary: The liver is normal in density without suspicious focal abnormality. No evidence of gallstones, gallbladder wall thickening or biliary dilatation. Pancreas: Unremarkable. No pancreatic ductal dilatation or surrounding inflammatory changes. Spleen: Normal in size without focal abnormality. Adrenals/Urinary Tract: Both adrenal glands appear normal. Probable punctate nonobstructing calculus in the lower pole of the right kidney. No evidence of ureteral calculus, hydronephrosis or suspicious renal lesion. There are small left renal cysts for which no specific follow-up imaging is recommended. Both ureters are patent. The bladder appears unremarkable for its degree of distention. Stomach/Bowel: No enteric contrast administered. The stomach appears unremarkable for its degree of distention. The small bowel, appendix and proximal colon appear normal. Possible mild rectal wall thickening which may relate to prior prostate cancer treatment. Vascular/Lymphatic: There are no enlarged abdominal or pelvic lymph nodes. Aortic and branch vessel atherosclerosis without evidence of aneurysm or large vessel occlusion. Venous findings described below. Reproductive: Prostate fiducial markers. There is ill-defined low density posteriorly in the left peripheral zone of the prostate gland measuring 2.4 x 1.5 cm on image 88/2. Mild surrounding soft tissue stranding. Other: No evidence of abdominal wall mass or hernia.  No ascites or pneumoperitoneum. Mild perirectal soft tissue stranding, likely treatment related. Musculoskeletal: No acute osseous findings. No evidence of metastatic prostate cancer. Mild multilevel lumbar spondylosis. IVC: Patent, without thrombus. Portal and mesenteric veins: The portal, superior mesenteric and splenic veins are patent. The hepatic veins are patent. The renal veins are patent. Bilateral iliac veins: The common, external and iliac veins are patent bilaterally. Right lower extremity: No definite DVT seen in the right lower extremity. There is suboptimal opacification of the femoral vein in the mid right thigh and of the lower leg veins. Scattered arterial calcifications. Left lower extremity: Extensive contiguous thrombosis extending from the common femoral vein into the popliteal vein and lower leg veins. This thrombosis appears nearly occlusive in several areas, and there is surrounding asymmetric soft tissue stranding. The profundus femoral vein appears patent. Scattered arterial calcifications without evidence of arterial occlusion. IMPRESSION: 1. As seen on earlier ultrasound, extensive contiguous thrombosis extending from the left common femoral vein into the popliteal vein and lower leg veins. This thrombosis appears nearly occlusive in several areas, and there is surrounding asymmetric soft tissue stranding. 2. No  definite DVT seen in the right lower extremity. No evidence of iliac vein or IVC thrombus. 3. No evidence of metastatic prostate cancer. 4. Ill-defined low density posteriorly in the left peripheral zone of the prostate gland with mild surrounding soft tissue stranding, likely treatment-related known tumor. 5. Probable punctate nonobstructing calculus in the lower pole of the right kidney. 6.  Aortic Atherosclerosis (ICD10-I70.0). Electronically Signed   By: Carey Bullocks M.D.   On: 07/09/2023 15:08   US Venous Img Lower Unilateral Left  Result Date: 07/09/2023 CLINICAL  DATA:  Left leg swelling EXAM: Left LOWER EXTREMITY VENOUS DOPPLER ULTRASOUND TECHNIQUE: Gray-scale sonography with compression, as well as color and duplex ultrasound, were performed to evaluate the deep venous system(s) from the level of the common femoral vein through the popliteal and proximal calf veins. COMPARISON:  None Available. FINDINGS: VENOUS There is poor compression of several vessels including the common femoral, femoral, popliteal and posterior tibial veins with poor flow on Doppler consistent with the areas of thrombus. Some areas are occlusive. Peroneal vein is poorly seen. Some areas of thrombus in the common femoral vein are slightly free floating with a more distal anchor. Limited views of the contralateral common femoral vein are unremarkable. OTHER Critical Value/emergent results were called by telephone at the time of interpretation on 07/09/2023 at 9:23 am PST to provider Marchelle Folks, who verbally acknowledged these results. Limitations: none IMPRESSION: Diffuse left lower extremity DVT Electronically Signed   By: Karen Kays M.D.   On: 07/09/2023 12:49    Procedures Procedures    Medications Ordered in ED Medications  apixaban (ELIQUIS) tablet 10 mg (10 mg Oral Given 07/09/23 1321)    Followed by  apixaban (ELIQUIS) tablet 5 mg (has no administration in time range)  iohexol (OMNIPAQUE) 350 MG/ML injection 125 mL (125 mLs Intravenous Contrast Given 07/09/23 1356)    ED Course/ Medical Decision Making/ A&P Clinical Course as of 07/09/23 1748  Sun Jul 09, 2023  1257 CT VENOGRAM ABD/PELVIS/LOWER EXT BILAT [AF]    Clinical Course User Index [AF] Arabella Merles, PA-C                                 Medical Decision Making Amount and/or Complexity of Data Reviewed Labs: ordered. Radiology: ordered. Decision-making details documented in ED Course.  Risk Prescription drug management.     Differential diagnosis includes but is not limited to soft tissue injury,  cellulitis, DVT, venous insufficiency  ED Course:  Patient overall well-appearing, no acute distress.  His left calf is diffusely edematous, no overlying erythema.  Calf is tender to palpation diffusely.  He does not have a palpable dorsalis pedis pulse on the left, but obtained with Doppler.  DVT study revealed extensive thrombus in the left lower extremity.  Vascular was consulted, they recommended venogram of the left lower extremity to further evaluate and to start patient on Eliquis.  Patient was given 10 mg Eliquis here. Patient received CT venogram of the left lower extremity.  This revealed extensive continuous thrombus sitting from the left common femoral vein to the popliteal vein. Almost, but not completely, occlusive.  Vascular surgery was consulted again.  They recommended continuing patient on Eliquis and having him follow-up with their office outpatient within the next week. Dr. Myra Gianotti will have his office call the patient, however, patient was also given the contact information for their office.  He was instructed to schedule an appointment  within the next week.  Impression: DVT left lower extremity  Disposition:  The patient was discharged home with instructions to take course of Eliquis as prescribed.  Follow-up with vascular within the next week. Return precautions given.  Lab Tests: I Ordered, and personally interpreted labs.  The pertinent results include:   CBC, BMP unremarkable  Imaging Studies ordered: I ordered imaging studies including ultrasound left lower extremity, CT venogram left lower extremity I independently visualized the imaging with scope of interpretation limited to determining acute life threatening conditions related to emergency care. Imaging showed thrombosis extending from the left common femoral vein into the popliteal vein in the left lower leg, nearly occlusive in several areas I agree with the radiologist interpretation    Consultations  Obtained: I requested consultation with the Vascular Dr Myra Gianotti,  and discussed lab and imaging findings as well as pertinent plan - they recommend: They recommended continuing patient on Eliquis and having him follow-up with their office outpatient.               Final Clinical Impression(s) / ED Diagnoses Final diagnoses:  Acute deep vein thrombosis (DVT) of femoral vein of right lower extremity (HCC)  Acute deep vein thrombosis (DVT) of popliteal vein of left lower extremity (HCC)    Rx / DC Orders ED Discharge Orders          Ordered    apixaban (ELIQUIS) 5 MG TABS tablet        07/09/23 1604              Arabella Merles, PA-C 07/09/23 1748    Ernie Avena, MD 07/10/23 [phone]

## 2023-07-09 NOTE — ED Triage Notes (Signed)
Left leg swelling which began yesterday and has been getting worse.  No sob.

## 2023-07-09 NOTE — ED Notes (Signed)
Blood drawn and sent to lab.

## 2023-07-09 NOTE — ED Notes (Signed)
Ultrasound in room

## 2023-07-09 NOTE — ED Notes (Signed)
Dc instructions reviewed with patient. Patient voiced understanding. Dc with belongings. Eliquis booklet discussed with patient and his wife,voiced understanding.

## 2023-07-09 NOTE — Discharge Instructions (Addendum)
The ultrasound and CT scan today showed signs of a blood clot in your left leg.  You have been started on a blood thinner called Eliquis (apixaban) today.  I have sent in a prescription to your pharmacy for this prescription.  Start taking this prescription tomorrow as prescribed.  Please follow-up with Dr. Myra Gianotti, the vascular surgeon, listed below.  His office should contact you within the next 1 to 2 days.  However, if you do not hear from them by Tuesday, please give them a call to schedule an appointment for this week.  Return to the ER if you have any severe worsening of your pain, numbness or tingling in your toes, shortness of breath, chest pain, any other new or concerning symptoms.

## 2023-07-13 ENCOUNTER — Telehealth: Payer: Self-pay

## 2023-07-13 NOTE — Telephone Encounter (Signed)
Caller: Patient  Concern: Increased pain since ED visit and starting Eliquis.  Pt denies missing any doses of Eliquis, he has been elevating with no improvement in swelling.  He is aware that it will take time to improve, but he states it feels worse than when he went to ED  Location: left leg  Description:  since 11/3  Treatments:  Instructed him to elevate properly, take OTC pain meds  Resolution: Appointment moved earlier and Instructed patient to proceed to [facility] ER if symptoms continue to worsen or he is unable to wait until his appt  Next Appt: Appointment scheduled for 07/17/23 @ 1420 with Dr. Myra Gianotti

## 2023-07-17 ENCOUNTER — Other Ambulatory Visit: Payer: Self-pay

## 2023-07-17 ENCOUNTER — Encounter: Payer: Self-pay | Admitting: Surgery

## 2023-07-17 ENCOUNTER — Ambulatory Visit: Payer: Medicare Other | Admitting: Surgery

## 2023-07-17 VITALS — BP 123/79 | HR 87 | Temp 98.2°F | Resp 20 | Ht 69.0 in | Wt 230.0 lb

## 2023-07-17 DIAGNOSIS — I82432 Acute embolism and thrombosis of left popliteal vein: Secondary | ICD-10-CM

## 2023-07-17 NOTE — Progress Notes (Signed)
[facility] [place]  Patient name: Casey Mullen MRN: [id number] DOB: September 25, 1950 Sex: male   REQUESTING PROVIDER:    ER   REASON FOR CONSULT:    DVT  HISTORY OF PRESENT ILLNESS:   Casey Mullen is a 72 y.o. male, who is referred for evaluation of a DVT.  He presented to the emergency department on 07/09/2023 with left calf swelling.  He was found to have a left leg DVT.  He was started on Eliquis and discharged home with follow-up today.  He is here with his wife.  He still has significant left leg swelling and discomfort despite keeping his legs elevated.  He has stopped his hormone therapy for his prostate cancer, which is felt to be the culprit of his DVT.  His wife measured the circumference of his calf at night and it has basically stayed at 17 cm.   PAST MEDICAL HISTORY    Past Medical History:  Diagnosis Date   BPH without obstruction/lower urinary tract symptoms    Coronary artery calcification seen on CAT scan 2020   previously seen by dr berry-- lov note in epic 09-18-2020 ,  cardiac CT 11-02-2018  calcium score=785 involving RCA and LAD   ED (erectile dysfunction)    GERD (gastroesophageal reflux disease)    History of gastric ulcer 2014   History of kidney stones    History of obstructive sleep apnea 2010   (01-24-2023  has another sleep study approx 3-4 yrs ago,  show no OSA)   sleep study in epic 11-21-2008 moderate osa , AHI 30.4/ hr,  recommended cpap,  pt had surgery s/p 01-12-2009  UPPP/ septoplasty/ tonsillectomy   Hyperlipidemia    Malignant neoplasm prostate (HCC) 10/2022   urologist--- dr gay/  radiation oncologist--  dr Kathrynn Running;   dx 02/ 2024,  Gleason 4+3   Microcytosis 2020   previously followed by hematology/ oncologist--- dr Leonides Schanz (released per lov note 02-21-2022)  no anemia;   hx macrocytosis with mild anemia been followed since 2020 macrocytosis / anemia resolved 06/ 2022   OA  (osteoarthritis)    knees   Thyroid nodule    left thyroid nodule,  bx 01-09-2020  benign,  followed by pcp   Wears glasses      FAMILY HISTORY   Family History  Problem Relation Age of Onset   Kidney failure Father    Nephrolithiasis Father    Hypertension Sister    Hypertension Brother     SOCIAL HISTORY:   Social History   Socioeconomic History   Marital status: Married    Spouse name: Not on file   Number of children: Not on file   Years of education: Not on file   Highest education level: Not on file  Occupational History   Not on file  Tobacco Use   Smoking status: Never   Smokeless tobacco: Never  Vaping Use   Vaping status: Never Used  Substance and Sexual Activity   Alcohol use: Not Currently   Drug use: Never   Sexual activity: Not on file  Other Topics Concern   Not on file  Social History Narrative   Not on file   Social Determinants of Health   Financial Resource Strain: Not on file  Food Insecurity: No Food Insecurity (11/18/2022)   Hunger Vital Sign    Worried About Running Out of Food in the Last Year: Never true    Ran Out of Food in the Last  Year: Never true  Transportation Needs: No Transportation Needs (11/18/2022)   PRAPARE - Administrator, Civil Service (Medical): No    Lack of Transportation (Non-Medical): No  Physical Activity: Not on file  Stress: Not on file  Social Connections: Not on file  Intimate Partner Violence: Not At Risk (11/18/2022)   Humiliation, Afraid, Rape, and Kick questionnaire    Fear of Current or Ex-Partner: No    Emotionally Abused: No    Physically Abused: No    Sexually Abused: No    ALLERGIES:    Allergies  Allergen Reactions   Azithromycin Rash   Penicillins Rash    CURRENT MEDICATIONS:    Current Outpatient Medications  Medication Sig Dispense Refill   apixaban (ELIQUIS) 5 MG TABS tablet Take 2 tablets (10mg ) twice daily for 7 days, then 1 tablet (5mg ) twice daily 60 tablet 0    Ascorbic Acid (VITAMIN C) 1000 MG tablet Take 1,000 mg by mouth daily.     Berberine Chloride (BERBERINE HCI PO) Take 1 capsule by mouth daily.     Cholecalciferol (VITAMIN D3) 25 MCG (1000 UT) CAPS Take 1 capsule by mouth daily.     Glucosamine-Chondroit-Calcium (TRIPLE FLEX BONE & JOINT PO) Take 1 capsule by mouth daily.     Red Yeast Rice Extract (RED YEAST RICE PO) Take 1 capsule by mouth 2 (two) times daily.     Turmeric 500 MG CAPS Take 1 capsule by mouth 2 (two) times daily.     relugolix (ORGOVYX) 120 MG tablet Take 120 mg by mouth daily with lunch. (Patient not taking: Reported on 07/17/2023)     No current facility-administered medications for this visit.    REVIEW OF SYSTEMS:   [X]  denotes positive finding, [ ]  denotes negative finding Cardiac  Comments:  Chest pain or chest pressure:    Shortness of breath upon exertion:    Short of breath when lying flat:    Irregular heart rhythm:        Vascular    Pain in calf, thigh, or hip brought on by ambulation:    Pain in feet at night that wakes you up from your sleep:     Blood clot in your veins:    Leg swelling:  x       Pulmonary    Oxygen at home:    Productive cough:     Wheezing:         Neurologic    Sudden weakness in arms or legs:     Sudden numbness in arms or legs:     Sudden onset of difficulty speaking or slurred speech:    Temporary loss of vision in one eye:     Problems with dizziness:         Gastrointestinal    Blood in stool:      Vomited blood:         Genitourinary    Burning when urinating:     Blood in urine:        Psychiatric    Major depression:         Hematologic    Bleeding problems:    Problems with blood clotting too easily:        Skin    Rashes or ulcers:        Constitutional    Fever or chills:     PHYSICAL EXAM:   Vitals:   07/17/23 1343  BP: 123/79  Pulse: 87  Resp:  20  Temp: 98.2 F (36.8 C)  SpO2: 93%  Weight: 230 lb (104.3 kg)  Height: 5\' 9"  (1.753  m)    GENERAL: The patient is a well-nourished male, in no acute distress. The vital signs are documented above. CARDIAC: There is a regular rate and rhythm.  VASCULAR: Left leg edema PULMONARY: Nonlabored respirations MUSCULOSKELETAL: There are no major deformities or cyanosis. NEUROLOGIC: No focal weakness or paresthesias are detected. SKIN: There are no ulcers or rashes noted. PSYCHIATRIC: The patient has a normal affect.  STUDIES:   I have reviewed his ultrasound with the following findings: There is poor compression of several vessels including the common femoral, femoral, popliteal and posterior tibial veins with poor flow on Doppler consistent with the areas of thrombus. Some areas are occlusive. Peroneal vein is poorly seen. Some areas of thrombus in the common femoral vein are slightly free floating with a more distal anchor.  CT venogram: 1. As seen on earlier ultrasound, extensive contiguous thrombosis extending from the left common femoral vein into the popliteal vein and lower leg veins. This thrombosis appears nearly occlusive in several areas, and there is surrounding asymmetric soft tissue stranding. 2. No definite DVT seen in the right lower extremity. No evidence of iliac vein or IVC thrombus. 3. No evidence of metastatic prostate cancer. 4. Ill-defined low density posteriorly in the left peripheral zone of the prostate gland with mild surrounding soft tissue stranding, likely treatment-related known tumor. 5. Probable punctate nonobstructing calculus in the lower pole of the right kidney. 6.  Aortic Atherosclerosis (ICD10-I70.0). ASSESSMENT and PLAN   Left femoral-popliteal DVT: I discussed with the patient that based off of the location of the DVT, we tried to treat this without intervention.  It has been 8 days since his DVT and he is still having significant issues with swelling.  The thrombus does go into the common femoral vein.  The profundofemoral vein  appears to be patent.  Because he appears to have failed nonprocedural therapy, I offered him left leg venous thrombectomy, which he very much wants to pursue.  This will be through a left popliteal approach.  He will not stop his Eliquis.  In order to get him treated this week, I am going to have to have him done by one of my partners which she is agreeable to.  He will need to get compression socks at the hospital prior to discharge   Durene Cal, IV, MD, FACS [facility] [phone] Pager [phone]

## 2023-07-19 ENCOUNTER — Other Ambulatory Visit: Payer: Self-pay

## 2023-07-19 ENCOUNTER — Ambulatory Visit (HOSPITAL_COMMUNITY)
Admission: RE | Admit: 2023-07-19 | Discharge: 2023-07-19 | Disposition: A | Discharge status: Home / Self Care | Payer: Medicare Other | Attending: Vascular Surgery | Admitting: Vascular Surgery

## 2023-07-19 ENCOUNTER — Encounter: Payer: Medicare Other | Admitting: Vascular Surgery

## 2023-07-19 ENCOUNTER — Encounter (HOSPITAL_COMMUNITY)
Admission: RE | Disposition: A | Discharge status: Home / Self Care | Payer: Self-pay | Source: Home / Self Care | Attending: Vascular Surgery

## 2023-07-19 DIAGNOSIS — C61 Malignant neoplasm of prostate: Secondary | ICD-10-CM | POA: Diagnosis not present

## 2023-07-19 DIAGNOSIS — I82412 Acute embolism and thrombosis of left femoral vein: Secondary | ICD-10-CM | POA: Diagnosis not present

## 2023-07-19 DIAGNOSIS — Z7901 Long term (current) use of anticoagulants: Secondary | ICD-10-CM | POA: Insufficient documentation

## 2023-07-19 DIAGNOSIS — I871 Compression of vein: Secondary | ICD-10-CM

## 2023-07-19 DIAGNOSIS — I82432 Acute embolism and thrombosis of left popliteal vein: Secondary | ICD-10-CM

## 2023-07-19 HISTORY — PX: PERIPHERAL VASCULAR ULTRASOUND/IVUS: CATH118334

## 2023-07-19 HISTORY — PX: PERIPHERAL VASCULAR THROMBECTOMY: CATH118306

## 2023-07-19 LAB — POCT I-STAT, CHEM 8
BUN: 23 mg/dL (ref 8–23)
Calcium, Ion: 1.25 mmol/L (ref 1.15–1.40)
Chloride: 101 mmol/L (ref 98–111)
Creatinine, Ser: 1 mg/dL (ref 0.61–1.24)
Glucose, Bld: 122 mg/dL — ABNORMAL HIGH (ref 70–99)
HCT: 39 % (ref 39.0–52.0)
Hemoglobin: 13.3 g/dL (ref 13.0–17.0)
Potassium: 3.9 mmol/L (ref 3.5–5.1)
Sodium: 136 mmol/L (ref 135–145)
TCO2: 24 mmol/L (ref 22–32)

## 2023-07-19 SURGERY — PERIPHERAL VASCULAR THROMBECTOMY
Anesthesia: LOCAL

## 2023-07-19 MED ORDER — IODIXANOL 320 MG/ML IV SOLN
INTRAVENOUS | Status: DC | PRN
  Administered 2023-07-19: 60 mL via INTRAVENOUS

## 2023-07-19 MED ORDER — FENTANYL CITRATE (PF) 100 MCG/2ML IJ SOLN
INTRAMUSCULAR | Status: AC
  Filled 2023-07-19: qty 2

## 2023-07-19 MED ORDER — SODIUM CHLORIDE 0.9 % WEIGHT BASED INFUSION: 1.0000 mL/kg/h | INTRAVENOUS | Status: DC

## 2023-07-19 MED ORDER — SODIUM CHLORIDE 0.9% FLUSH: 3.0000 mL | INTRAVENOUS | Status: DC | PRN

## 2023-07-19 MED ORDER — HEPARIN SODIUM (PORCINE) 1000 UNIT/ML IJ SOLN
INTRAMUSCULAR | Status: DC | PRN
  Administered 2023-07-19: 10000 [IU] via INTRAVENOUS

## 2023-07-19 MED ORDER — SODIUM CHLORIDE 0.9% FLUSH: 3.0000 mL | Freq: Two times a day (BID) | INTRAVENOUS | Status: DC

## 2023-07-19 MED ORDER — SODIUM CHLORIDE 0.9 % IV SOLN: INTRAVENOUS | Status: DC

## 2023-07-19 MED ORDER — LIDOCAINE HCL (PF) 1 % IJ SOLN
INTRAMUSCULAR | Status: DC | PRN
  Administered 2023-07-19: 12 mL

## 2023-07-19 MED ORDER — FENTANYL CITRATE (PF) 100 MCG/2ML IJ SOLN
INTRAMUSCULAR | Status: DC | PRN
  Administered 2023-07-19: 50 ug via INTRAVENOUS

## 2023-07-19 MED ORDER — ONDANSETRON HCL 4 MG/2ML IJ SOLN: 4.0000 mg | Freq: Four times a day (QID) | INTRAMUSCULAR | Status: DC | PRN

## 2023-07-19 MED ORDER — LIDOCAINE HCL (PF) 1 % IJ SOLN
INTRAMUSCULAR | Status: AC
  Filled 2023-07-19: qty 30

## 2023-07-19 MED ORDER — HEPARIN (PORCINE) IN NACL 1000-0.9 UT/500ML-% IV SOLN
INTRAVENOUS | Status: DC | PRN
  Administered 2023-07-19: 500 mL

## 2023-07-19 MED ORDER — LABETALOL HCL 5 MG/ML IV SOLN: 10.0000 mg | INTRAVENOUS | Status: DC | PRN

## 2023-07-19 MED ORDER — MIDAZOLAM HCL 2 MG/2ML IJ SOLN
INTRAMUSCULAR | Status: DC | PRN
  Administered 2023-07-19: 1 mg via INTRAVENOUS

## 2023-07-19 MED ORDER — HYDRALAZINE HCL 20 MG/ML IJ SOLN: 5.0000 mg | INTRAMUSCULAR | Status: DC | PRN

## 2023-07-19 MED ORDER — SODIUM CHLORIDE 0.9 % IV SOLN: 250.0000 mL | INTRAVENOUS | Status: DC | PRN

## 2023-07-19 MED ORDER — MIDAZOLAM HCL 2 MG/2ML IJ SOLN
INTRAMUSCULAR | Status: AC
  Filled 2023-07-19: qty 2

## 2023-07-19 MED ORDER — ACETAMINOPHEN 325 MG PO TABS: 650.0000 mg | ORAL_TABLET | ORAL | Status: DC | PRN

## 2023-07-19 SURGICAL SUPPLY — 17 items
BALLN MUSTANG 12.0X40 75 (BALLOONS) ×2
BALLN MUSTANG 12X60X135 (BALLOONS) ×2
BALLOON MUSTANG 12.0X40 75 (BALLOONS) IMPLANT
BALLOON MUSTANG 12X60X135 (BALLOONS) IMPLANT
CANISTER PENUMBRA ENGINE (MISCELLANEOUS) IMPLANT
CATH LIGHTNI FLASH 16XTORQ 100 (CATHETERS) IMPLANT
CATH VISIONS PV .035 IVUS (CATHETERS) IMPLANT
COVER DOME SNAP 22 D (MISCELLANEOUS) IMPLANT
GLIDEWIRE ADV .035X260CM (WIRE) IMPLANT
KIT ENCORE 26 ADVANTAGE (KITS) IMPLANT
KIT MICROPUNCTURE NIT STIFF (SHEATH) IMPLANT
KIT PV (KITS) IMPLANT
SHEATH PERFORMER 16FR 30 (SHEATH) IMPLANT
SHEATH PINNACLE 8F 10CM (SHEATH) IMPLANT
SHEATH PROBE COVER 6X72 (BAG) IMPLANT
TRAY PV CATH (CUSTOM PROCEDURE TRAY) IMPLANT
WIRE TORQFLEX AUST .018X40CM (WIRE) IMPLANT

## 2023-07-19 NOTE — Op Note (Signed)
    Patient name: Casey Mullen MRN: [id number] DOB: [birth year] Sex: male  07/19/2023 Pre-operative Diagnosis: Symptomatic left lower extremity venous thrombosis Post-operative diagnosis:  Same Surgeon:  Victorino Sparrow, MD Procedure Performed: 1.  micropuncture access of the left popliteal vein in antegrade fashion 2.  Left lower extremity venogram 3.  Ilio cavogram 4.  Intravascular ultrasound-inferior vena cava, left common iliac vein, external iliac vein, common femoral vein, superficial femoral vein 5.  Percutaneous mechanical thrombectomy 16 French penumbra lightening 6.  External iliac vein venoplasty 12 x 40 mm, 12 x 60 mm 7.  Percutaneous access site managed with Monocryl   Indications: Patient is a 72 year old male with a history of prostate cancer who originally presented to outside hospital with new onset left lower extremity pain and swelling.  Imaging demonstrated extensive deep venous thrombosis extending from the common femoral vein into the tibial veins.  He he was very symptomatic, having difficulty walking.  After assessing the risks and benefits of percutaneous mechanical thrombectomy, [name] elected to proceed.  Findings:  Thrombosed popliteal vein, femoral vein, partially thrombosed common femoral vein Patent external iliac vein with severe stenosis, widely patent left common iliac vein. No May Thurner syndrome External iliac vein stenosis likely from prior radiation.   Procedure:  The patient was identified in the holding area and taken to room 8.  The patient was then placed supine on the table and prepped and draped in the usual sterile fashion.  A time out was called.  Ultrasound was used to evaluate the left popliteal vein.  This was accessed using an ultrasound-guided micropuncture needle.  A wire was run into the right subclavian vein.  Next, venography of the left lower extremity, left sided iliac system followed.  See results above.  I then elected to use  ultrasound to assess whether the left common iliac vein was compressed from the right common iliac artery-May Thurner.  It was not.  I then elected to use a 16 French penumbra system to evacuate the clot.  A 16 French sheath was placed followed by the CAT 16 lightening penumbra system.  In total, 2 passes were made.  Follow-up angiography demonstrated significant improvement, there was residual stenosis of the external iliac vein on both venography and intravascular ultrasound, therefore I elected to use a 12 x 40 mm stent.  There was no wasting when the stent was inflated, however there was rebound, and the vein stayed small.  In total, 9 minutes were spent inflated in an effort to improve the stenosis, however the rebound continued.  I elected not to put a stent as the patient is still being treated for prostate cancer.  Furthermore, the distal external iliac vein was not thrombosed, therefore the stenotic lesion was not the culprit for the left lower extremity thrombosis.  All wires and sheaths were removed and the percutaneous access site was managed with a Monocryl suture.  Recommend lifelong anticoagulation.  Victorino Sparrow MD [facility] [place] Office: [phone]

## 2023-07-19 NOTE — H&P (Signed)
Patient seen and examined in preop holding.  No complaints. No changes to medication history or physical exam since last seen in clinic. After discussing the risks and benefits of left leg mechanical venous thrombectomy, [name] elected to proceed.   Victorino Sparrow MD   [facility]  Patient name: Casey Mullen MRN: [id number] DOB: Feb 23, 1951 Sex: male   REQUESTING PROVIDER:    ER   REASON FOR CONSULT:    DVT  HISTORY OF PRESENT ILLNESS:   [name] is a 72 y.o. male, who is referred for evaluation of a DVT.  He presented to the emergency department on 07/09/2023 with left calf swelling.  He was found to have a left leg DVT.  He was started on Eliquis and discharged home with follow-up today.  He is here with his wife.  He still has significant left leg swelling and discomfort despite keeping his legs elevated.  He has stopped his hormone therapy for his prostate cancer, which is felt to be the culprit of his DVT.  His wife measured the circumference of his calf at night and it has basically stayed at 17 cm.   PAST MEDICAL HISTORY    Past Medical History:  Diagnosis Date   BPH without obstruction/lower urinary tract symptoms    Coronary artery calcification seen on CAT scan 2020   previously seen by dr berry-- lov note in epic 09-18-2020 ,  cardiac CT 11-02-2018  calcium score=785 involving RCA and LAD   ED (erectile dysfunction)    GERD (gastroesophageal reflux disease)    History of gastric ulcer 2014   History of kidney stones    History of obstructive sleep apnea 2010   (01-24-2023  has another sleep study approx 3-4 yrs ago,  show no OSA)   sleep study in epic 11-21-2008 moderate osa , AHI 30.4/ hr,  recommended cpap,  pt had surgery s/p 01-12-2009  UPPP/ septoplasty/ tonsillectomy   Hyperlipidemia    Malignant neoplasm prostate (HCC) 10/2022   urologist--- dr gay/  radiation oncologist--  dr  Kathrynn Running;   dx 02/ 2024,  Gleason 4+3   Microcytosis 2020   previously followed by hematology/ oncologist--- dr Leonides Schanz (released per lov note 02-21-2022)  no anemia;   hx macrocytosis with mild anemia been followed since 2020 macrocytosis / anemia resolved 06/ 2022   OA (osteoarthritis)    knees   Thyroid nodule    left thyroid nodule,  bx 01-09-2020  benign,  followed by pcp   Wears glasses      FAMILY HISTORY   Family History  Problem Relation Age of Onset   Kidney failure Father    Nephrolithiasis Father    Hypertension Sister    Hypertension Brother     SOCIAL HISTORY:   Social History   Socioeconomic History   Marital status: Married    Spouse name: Not on file   Number of children: Not on file   Years of education: Not on file   Highest education level: Not on file  Occupational History   Not on file  Tobacco Use   Smoking status: Never   Smokeless tobacco: Never  Vaping Use   Vaping status: Never Used  Substance and Sexual Activity   Alcohol use: Not Currently   Drug use: Never   Sexual activity: Not on file  Other Topics Concern   Not on file  Social History Narrative   Not on file  Social Determinants of Health   Financial Resource Strain: Not on file  Food Insecurity: No Food Insecurity (11/18/2022)   Hunger Vital Sign    Worried About Running Out of Food in the Last Year: Never true    Ran Out of Food in the Last Year: Never true  Transportation Needs: No Transportation Needs (11/18/2022)   PRAPARE - Administrator, Civil Service (Medical): No    Lack of Transportation (Non-Medical): No  Physical Activity: Not on file  Stress: Not on file  Social Connections: Not on file  Intimate Partner Violence: Not At Risk (11/18/2022)   Humiliation, Afraid, Rape, and Kick questionnaire    Fear of Current or Ex-Partner: No    Emotionally Abused: No    Physically Abused: No    Sexually Abused: No    ALLERGIES:    Allergies  Allergen  Reactions   Azithromycin Rash   Penicillins Rash    CURRENT MEDICATIONS:    Current Facility-Administered Medications  Medication Dose Route Frequency Provider Last Rate Last Admin   0.9 %  sodium chloride infusion   Intravenous Continuous Victorino Sparrow, MD 100 mL/hr at 07/19/23 1127 New Bag at 07/19/23 1127   fentaNYL (SUBLIMAZE) injection    PRN Victorino Sparrow, MD   50 mcg at 07/19/23 1322   Heparin (Porcine) in NaCl 1000-0.9 UT/500ML-% SOLN    PRN Victorino Sparrow, MD   500 mL at 07/19/23 1415   heparin sodium (porcine) injection    PRN Victorino Sparrow, MD   10,000 Units at 07/19/23 1330   iodixanol (VISIPAQUE) 320 MG/ML injection    PRN Victorino Sparrow, MD   60 mL at 07/19/23 1415   lidocaine (PF) (XYLOCAINE) 1 % injection    PRN Victorino Sparrow, MD   12 mL at 07/19/23 1323   midazolam (VERSED) injection    PRN Victorino Sparrow, MD   1 mg at 07/19/23 1322    REVIEW OF SYSTEMS:   [X]  denotes positive finding, [ ]  denotes negative finding Cardiac  Comments:  Chest pain or chest pressure:    Shortness of breath upon exertion:    Short of breath when lying flat:    Irregular heart rhythm:        Vascular    Pain in calf, thigh, or hip brought on by ambulation:    Pain in feet at night that wakes you up from your sleep:     Blood clot in your veins:    Leg swelling:  x       Pulmonary    Oxygen at home:    Productive cough:     Wheezing:         Neurologic    Sudden weakness in arms or legs:     Sudden numbness in arms or legs:     Sudden onset of difficulty speaking or slurred speech:    Temporary loss of vision in one eye:     Problems with dizziness:         Gastrointestinal    Blood in stool:      Vomited blood:         Genitourinary    Burning when urinating:     Blood in urine:        Psychiatric    Major depression:         Hematologic    Bleeding problems:    Problems with blood clotting too easily:  Skin    Rashes or ulcers:         Constitutional    Fever or chills:     PHYSICAL EXAM:   Vitals:   07/19/23 1316 07/19/23 1316 07/19/23 1320 07/19/23 1325  BP:  127/69 135/69 123/69  Pulse:  71 66 69  Resp:  16 18 17   Temp:      TempSrc:      SpO2: 100% 95% 95% 97%  Weight:      Height:        GENERAL: The patient is a well-nourished male, in no acute distress. The vital signs are documented above. CARDIAC: There is a regular rate and rhythm.  VASCULAR: Left leg edema PULMONARY: Nonlabored respirations MUSCULOSKELETAL: There are no major deformities or cyanosis. NEUROLOGIC: No focal weakness or paresthesias are detected. SKIN: There are no ulcers or rashes noted. PSYCHIATRIC: The patient has a normal affect.  STUDIES:   I have reviewed his ultrasound with the following findings: There is poor compression of several vessels including the common femoral, femoral, popliteal and posterior tibial veins with poor flow on Doppler consistent with the areas of thrombus. Some areas are occlusive. Peroneal vein is poorly seen. Some areas of thrombus in the common femoral vein are slightly free floating with a more distal anchor.  CT venogram: 1. As seen on earlier ultrasound, extensive contiguous thrombosis extending from the left common femoral vein into the popliteal vein and lower leg veins. This thrombosis appears nearly occlusive in several areas, and there is surrounding asymmetric soft tissue stranding. 2. No definite DVT seen in the right lower extremity. No evidence of iliac vein or IVC thrombus. 3. No evidence of metastatic prostate cancer. 4. Ill-defined low density posteriorly in the left peripheral zone of the prostate gland with mild surrounding soft tissue stranding, likely treatment-related known tumor. 5. Probable punctate nonobstructing calculus in the lower pole of the right kidney. 6.  Aortic Atherosclerosis (ICD10-I70.0). ASSESSMENT and PLAN   Left femoral-popliteal DVT: I discussed  with the patient that based off of the location of the DVT, we tried to treat this without intervention.  It has been 8 days since his DVT and he is still having significant issues with swelling.  The thrombus does go into the common femoral vein.  The profundofemoral vein appears to be patent.  Because he appears to have failed nonprocedural therapy, I offered him left leg venous thrombectomy, which he very much wants to pursue.  This will be through a left popliteal approach.  He will not stop his Eliquis.  In order to get him treated this week, I am going to have to have him done by one of my partners which she is agreeable to.  He will need to get compression socks at the hospital prior to discharge   Durene Cal, IV, MD, FACS [facility] [phone] Pager [phone]

## 2023-07-20 ENCOUNTER — Encounter (HOSPITAL_COMMUNITY): Payer: Self-pay | Admitting: Vascular Surgery

## 2023-07-21 DIAGNOSIS — M7989 Other specified soft tissue disorders: Secondary | ICD-10-CM

## 2023-07-31 ENCOUNTER — Other Ambulatory Visit: Payer: Self-pay | Admitting: *Deleted

## 2023-07-31 DIAGNOSIS — I82432 Acute embolism and thrombosis of left popliteal vein: Secondary | ICD-10-CM

## 2023-08-16 NOTE — Progress Notes (Unsigned)
HISTORY AND PHYSICAL     CC:  follow up. Requesting Provider:  [name], MD  HPI: This is a 72 y.o. male who is here today for follow up.  He underwent LLE venogram with percutaneous mechanical thrombectomy and external iliac vein venoplasty on 07/19/2023 by Dr. Karin Lieu.  There was no May Thurner and felt EIV stenosis likely due to prior radiation.   He has history of prostate cancer who originally presented to outside hospital with new onset left lower extremity pain and swelling. Imaging demonstrated extensive deep venous thrombosis extending from the common femoral vein into the tibial veins. He he was very symptomatic, having difficulty walking. After assessing the risks and benefits of percutaneous mechanical thrombectomy, Casey Mullen elected to proceed.   The pt returns today for follow up.  ***  The pt is not on a statin for cholesterol management.    The pt is not on an aspirin.    Other AC:  Eliquis The pt is not on medication for hypertension.  The pt is not on medication for diabetes. Tobacco hx:  never  Pt does *** have family hx of AAA.  Past Medical History:  Diagnosis Date   BPH without obstruction/lower urinary tract symptoms    Coronary artery calcification seen on CAT scan 2020   previously seen by dr berry-- lov note in epic 09-18-2020 ,  cardiac CT 11-02-2018  calcium score=785 involving RCA and LAD   ED (erectile dysfunction)    GERD (gastroesophageal reflux disease)    History of gastric ulcer 2014   History of kidney stones    History of obstructive sleep apnea 2010   (01-24-2023  has another sleep study approx 3-4 yrs ago,  show no OSA)   sleep study in epic 11-21-2008 moderate osa , AHI 30.4/ hr,  recommended cpap,  pt had surgery s/p 01-12-2009  UPPP/ septoplasty/ tonsillectomy   Hyperlipidemia    Malignant neoplasm prostate (HCC) 10/2022   urologist--- dr gay/  radiation oncologist--  dr Kathrynn Running;   dx 02/ 2024,  Gleason 4+3   Microcytosis 2020    previously followed by hematology/ oncologist--- dr Leonides Schanz (released per lov note 02-21-2022)  no anemia;   hx macrocytosis with mild anemia been followed since 2020 macrocytosis / anemia resolved 06/ 2022   OA (osteoarthritis)    knees   Thyroid nodule    left thyroid nodule,  bx 01-09-2020  benign,  followed by pcp   Wears glasses     Past Surgical History:  Procedure Laterality Date   COLONOSCOPY  2019   ESOPHAGOGASTRODUODENOSCOPY  2014   GOLD SEED IMPLANT N/A 02/07/2023   Procedure: GOLD SEED IMPLANT;  Surgeon: Casey Field, MD;  Location: [facility];  Service: Urology;  Laterality: N/A;   PERIPHERAL VASCULAR THROMBECTOMY N/A 07/19/2023   Procedure: MECHANICAL VENOUS THROMBECTOMY;  Surgeon: Victorino Sparrow, MD;  Location: [facility] INVASIVE CV LAB;  Service: Cardiovascular;  Laterality: N/A;   PERIPHERAL VASCULAR ULTRASOUND/IVUS  07/19/2023   Procedure: Peripheral Vascular Ultrasound/IVUS;  Surgeon: Victorino Sparrow, MD;  Location: [facility] INVASIVE CV LAB;  Service: Cardiovascular;;   SPACE OAR INSTILLATION N/A 02/07/2023   Procedure: SPACE OAR INSTILLATION;  Surgeon: Casey Field, MD;  Location: [facility];  Service: Urology;  Laterality: N/A;   UVULOPALATOPHARYNGOPLASTY (UPPP)/TONSILLECTOMY/SEPTOPLASTY Bilateral 01/12/2009   @[facility]  dr Casey Mullen;   bilateral tonsillecotmy /  septal reconstruction/  tubinate reduction    Allergies  Allergen Reactions   Azithromycin Rash   Penicillins Rash  Current Outpatient Medications  Medication Sig Dispense Refill   apixaban (ELIQUIS) 5 MG TABS tablet Take 2 tablets (10mg ) twice daily for 7 days, then 1 tablet (5mg ) twice daily 60 tablet 0   Ascorbic Acid (VITAMIN C) 1000 MG tablet Take 1,000 mg by mouth in the morning.     Berberine Chloride (BERBERINE HCI PO) Take 1 capsule by mouth daily.     Cholecalciferol (VITAMIN D3) 25 MCG (1000 UT) CAPS Take 1 capsule by mouth in the morning.      Glucosamine-Chondroit-Calcium (TRIPLE FLEX BONE & JOINT PO) Take 1 capsule by mouth in the morning.     Omega-3 Fatty Acids (FISH OIL PO) Take 1 capsule by mouth in the morning.     Red Yeast Rice Extract (RED YEAST RICE PO) Take 1 capsule by mouth 2 (two) times daily.     Turmeric 500 MG CAPS Take 1 capsule by mouth 2 (two) times daily.     No current facility-administered medications for this visit.    Family History  Problem Relation Age of Onset   Kidney failure Father    Nephrolithiasis Father    Hypertension Sister    Hypertension Brother     Social History   Socioeconomic History   Marital status: Married    Spouse name: Not on file   Number of children: Not on file   Years of education: Not on file   Highest education level: Not on file  Occupational History   Not on file  Tobacco Use   Smoking status: Never   Smokeless tobacco: Never  Vaping Use   Vaping status: Never Used  Substance and Sexual Activity   Alcohol use: Not Currently   Drug use: Never   Sexual activity: Not on file  Other Topics Concern   Not on file  Social History Narrative   Not on file   Social Determinants of Health   Financial Resource Strain: Not on file  Food Insecurity: No Food Insecurity (11/18/2022)   Hunger Vital Sign    Worried About Running Out of Food in the Last Year: Never true    Ran Out of Food in the Last Year: Never true  Transportation Needs: No Transportation Needs (11/18/2022)   PRAPARE - Administrator, Civil Service (Medical): No    Lack of Transportation (Non-Medical): No  Physical Activity: Not on file  Stress: Not on file  Social Connections: Not on file  Intimate Partner Violence: Not At Risk (11/18/2022)   Humiliation, Afraid, Rape, and Kick questionnaire    Fear of Current or Ex-Partner: No    Emotionally Abused: No    Physically Abused: No    Sexually Abused: No     REVIEW OF SYSTEMS:  *** [X]  denotes positive finding, [ ]  denotes  negative finding Cardiac  Comments:  Chest pain or chest pressure:    Shortness of breath upon exertion:    Short of breath when lying flat:    Irregular heart rhythm:        Vascular    Pain in calf, thigh, or hip brought on by ambulation:    Pain in feet at night that wakes you up from your sleep:     Blood clot in your veins:    Leg swelling:         Pulmonary    Oxygen at home:    Productive cough:     Wheezing:         Neurologic  Sudden weakness in arms or legs:     Sudden numbness in arms or legs:     Sudden onset of difficulty speaking or slurred speech:    Temporary loss of vision in one eye:     Problems with dizziness:         Gastrointestinal    Blood in stool:     Vomited blood:         Genitourinary    Burning when urinating:     Blood in urine:        Psychiatric    Major depression:         Hematologic    Bleeding problems:    Problems with blood clotting too easily:        Skin    Rashes or ulcers:        Constitutional    Fever or chills:      PHYSICAL EXAMINATION:  ***  General:  WDWN in NAD; vital signs documented above Gait: Not observed HENT: WNL, normocephalic Pulmonary: normal non-labored breathing , without wheezing Cardiac: {Desc; regular/irreg:14544} HR, {With/Without:20273} carotid bruit*** Abdomen: soft, NT; aortic pulse is *** palpable Skin: {With/Without:20273} rashes Vascular Exam/Pulses:  Right Left  Radial {Exam; arterial pulse strength 0-4:30167} {Exam; arterial pulse strength 0-4:30167}  DP {Exam; arterial pulse strength 0-4:30167} {Exam; arterial pulse strength 0-4:30167}  PT {Exam; arterial pulse strength 0-4:30167} {Exam; arterial pulse strength 0-4:30167}   Extremities: {With/Without:20273} ischemic changes, {With/Without:20273} Gangrene , {With/Without:20273} cellulitis; {With/Without:20273} open wounds Musculoskeletal: no muscle wasting or atrophy  Neurologic: A&O X 3 Psychiatric:  The pt has {Desc;  normal/abnormal:11317::"Normal"} affect.   Non-Invasive Vascular Imaging:   Venous duplex on 08/17/2023: ***    ASSESSMENT/PLAN:: 72 y.o. male here for follow up for *** with hx of ***   -*** -continue *** -pt will f/u in *** with ***.   Doreatha Massed, [facility] [phone]  Clinic MD:   Karin Lieu

## 2023-08-17 ENCOUNTER — Ambulatory Visit (INDEPENDENT_AMBULATORY_CARE_PROVIDER_SITE_OTHER): Payer: Medicare Other | Admitting: Physician Assistant

## 2023-08-17 ENCOUNTER — Ambulatory Visit (HOSPITAL_COMMUNITY)
Admission: RE | Admit: 2023-08-17 | Discharge: 2023-08-17 | Disposition: A | Discharge status: Home / Self Care | Payer: Medicare Other | Source: Ambulatory Visit | Attending: Vascular Surgery | Admitting: Vascular Surgery

## 2023-08-17 ENCOUNTER — Encounter: Payer: Self-pay | Admitting: Physician Assistant

## 2023-08-17 VITALS — BP 145/80 | HR 67 | Temp 98.4°F | Resp 20 | Ht 69.0 in | Wt 230.0 lb

## 2023-08-17 DIAGNOSIS — I82432 Acute embolism and thrombosis of left popliteal vein: Secondary | ICD-10-CM

## 2023-08-17 DIAGNOSIS — N3000 Acute cystitis without hematuria: Secondary | ICD-10-CM | POA: Diagnosis not present

## 2023-08-17 DIAGNOSIS — N451 Epididymitis: Secondary | ICD-10-CM | POA: Diagnosis not present

## 2023-08-17 DIAGNOSIS — R8271 Bacteriuria: Secondary | ICD-10-CM | POA: Diagnosis not present

## 2023-08-17 MED ORDER — APIXABAN 5 MG PO TABS: ORAL_TABLET | ORAL | 6 refills | Status: AC

## 2023-09-20 DIAGNOSIS — R413 Other amnesia: Secondary | ICD-10-CM | POA: Diagnosis not present

## 2023-09-20 DIAGNOSIS — R61 Generalized hyperhidrosis: Secondary | ICD-10-CM | POA: Diagnosis not present

## 2023-09-20 DIAGNOSIS — R1319 Other dysphagia: Secondary | ICD-10-CM | POA: Diagnosis not present

## 2023-09-20 DIAGNOSIS — Z86718 Personal history of other venous thrombosis and embolism: Secondary | ICD-10-CM | POA: Diagnosis not present

## 2023-09-22 ENCOUNTER — Other Ambulatory Visit: Payer: Self-pay | Admitting: Family Medicine

## 2023-09-22 DIAGNOSIS — R1319 Other dysphagia: Secondary | ICD-10-CM

## 2023-09-22 DIAGNOSIS — R413 Other amnesia: Secondary | ICD-10-CM

## 2023-10-05 ENCOUNTER — Ambulatory Visit
Admission: RE | Admit: 2023-10-05 | Discharge: 2023-10-05 | Disposition: A | Discharge status: Home / Self Care | Payer: Medicare Other | Source: Ambulatory Visit | Attending: Family Medicine | Admitting: Family Medicine

## 2023-10-05 DIAGNOSIS — R413 Other amnesia: Secondary | ICD-10-CM | POA: Diagnosis not present

## 2023-10-05 MED ORDER — GADOBUTROL 1 MMOL/ML IV SOLN
10.0000 mL | Freq: Once | INTRAVENOUS | Status: AC | PRN
  Administered 2023-10-05: 10 mL via INTRAVENOUS

## 2023-10-12 ENCOUNTER — Ambulatory Visit
Admission: RE | Admit: 2023-10-12 | Discharge: 2023-10-12 | Disposition: A | Discharge status: Home / Self Care | Payer: Medicare Other | Source: Ambulatory Visit | Attending: Family Medicine | Admitting: Family Medicine

## 2023-10-12 DIAGNOSIS — R1319 Other dysphagia: Secondary | ICD-10-CM

## 2023-10-12 DIAGNOSIS — R1314 Dysphagia, pharyngoesophageal phase: Secondary | ICD-10-CM | POA: Diagnosis not present

## 2023-10-12 DIAGNOSIS — K224 Dyskinesia of esophagus: Secondary | ICD-10-CM | POA: Diagnosis not present

## 2023-11-16 DIAGNOSIS — R413 Other amnesia: Secondary | ICD-10-CM | POA: Diagnosis not present

## 2023-11-16 DIAGNOSIS — R1310 Dysphagia, unspecified: Secondary | ICD-10-CM | POA: Diagnosis not present

## 2023-11-16 DIAGNOSIS — R7301 Impaired fasting glucose: Secondary | ICD-10-CM | POA: Diagnosis not present

## 2023-11-16 DIAGNOSIS — Z86718 Personal history of other venous thrombosis and embolism: Secondary | ICD-10-CM | POA: Diagnosis not present

## 2023-11-27 ENCOUNTER — Ambulatory Visit: Admitting: Neurology

## 2023-11-27 ENCOUNTER — Encounter: Payer: Self-pay | Admitting: Neurology

## 2023-11-27 VITALS — BP 141/73 | HR 81 | Ht 69.0 in | Wt 240.0 lb

## 2023-11-27 DIAGNOSIS — R413 Other amnesia: Secondary | ICD-10-CM

## 2023-11-27 DIAGNOSIS — G3184 Mild cognitive impairment, so stated: Secondary | ICD-10-CM

## 2023-11-27 NOTE — Progress Notes (Addendum)
 [facility]EP [facility]    Provider:  Melvyn Novas, MD  Primary Care Physician:  [name], MD [phone] [place] [address]     Referring Provider: [name], Md [phone] [address] [place],  [place] [address]          Chief Complaint according to patient   Patient presents with:     New Patient (Initial Visit)           HISTORY OF PRESENT ILLNESS:  Casey Mullen is a 73 y.o. male patient who is seen upon referral on 11/27/2023 from Severiano Gilbert , MD ,  for an evaluation of STM loss. .  Chief concern according to patient's wife; " We may have a conversation in the morning and by noon its forgotten" and : " we may run errands together and he forgets  what we already have finished,  he looked for the mail several times after we took the post  in- he had already reviewed the mail" .  Banking : doesn't use PIn numbers , never goes to ATM,  online or use the teller. " Passwords have to be written down. He keeps appointment  through phone reminders- uses an  electronic calendar. He takes mostly supplement -Eloquis is his only prescription drug and he reports he has no trouble taking it with regularity.     Casey Mullen was seen  on 11/27/23 , a right -handed male with a STM disorder,   He retired at age 65 , was in Air[employer].   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  Never a smoker, seldomly drinks ETOH, does consume caffeine:  1 cup of coffee in AM, no sodas, unsweetened hot or iced tea - lipton tea, when eating out.   Regular meal times - most meals prepared and eaten at home,  eating out twice a week. Regular Exercise : pickle ball.  Hobbies :pickle ball.  Bedtime is now 9 PM and he stays   in bed until 8-9 AM.  10 hours or more, reports hypersomnia.  This is new ( his wife gave that information) , usually he would wake up ay 7 AM, until last year.  His need to sleep changed with the prostate cancer therapy she felt- and frequent urination 2-4 times.  His  prostate cancer hormone treatment caused hot flushes.  Developed DVT in the left leg, just in November 2024 , remains now on Eloquis.  Sleep habits are as follows: The patient's dinner time is between 6-7 PM. The patient goes to bed at 9 PM and continues to sleep for 7-8 hours, wakes for 2-4 bathroom breaks, the first time at 2 AM.    Naps are taken frequently after sports , lasting from 20 to 30 minutes and are more refreshing than nocturnal sleep. Exercise in form of pickle ball.  Hobbies :pickle ball.  Family medical  history: mother lived to 44, last 10 years with cognitive decline, brother Derryl Harbor has dementia, he was learning disabled, had been gainfully employed in [employer] , helped setting tables, etc. Now  requiring 24 hour care in the home of  one of his brothers. 3 cognitively healthy siblings , the youngest one with atrial fib and pacemaker.         Review of Systems: Out of a complete 14 system review, the patient complains of only the following symptoms, and all other reviewed systems are negative.:     Coughing, dysphagia- dysphonia.   Social History   Socioeconomic History   Marital status: Married    Spouse name: Not on file   Number of children: Not on file   Years of education: Not on file   Highest education level: Not on file  Occupational History   Not on file  Tobacco Use   Smoking status: Never   Smokeless tobacco: Never  Vaping Use   Vaping status: Never Used  Substance and Sexual Activity   Alcohol use: Not Currently   Drug use: Never   Sexual activity: Not on file  Other Topics Concern   Not on file  Social History Narrative   Not on file   Social Drivers of Health   Financial  Resource Strain: Not on file  Food Insecurity: No Food Insecurity (11/18/2022)   Hunger Vital Sign    Worried About Running Out of Food in the Last Year: Never true    Ran Out of Food in the Last Year: Never true  Transportation Needs: No Transportation Needs (11/18/2022)   PRAPARE - Administrator, Civil Service (Medical): No    Lack of Transportation (Non-Medical): No  Physical Activity: Not on file  Stress: Not on file  Social Connections: Not on file    Family History  Problem Relation Age of Onset   Kidney failure Father    Nephrolithiasis Father    Hypertension Sister    Hypertension Brother     Past Medical History:  Diagnosis Date   BPH without obstruction/lower urinary tract symptoms    Coronary artery calcification seen on CAT scan 2020   previously seen by dr berry-- lov note in epic 09-18-2020 ,  cardiac CT 11-02-2018  calcium score=785 involving RCA and LAD   ED (erectile dysfunction)    GERD (gastroesophageal reflux disease)    History of gastric ulcer 2014   History of kidney stones    History of obstructive sleep apnea 2010   (01-24-2023  has another sleep study approx 3-4 yrs ago,  show no OSA)   sleep study in epic 11-21-2008 moderate osa , AHI 30.4/ hr,  recommended cpap,  pt had surgery s/p 01-12-2009  UPPP/ septoplasty/ tonsillectomy   Hyperlipidemia    Malignant neoplasm prostate (HCC) 10/2022   urologist--- dr gay/  radiation oncologist--  dr Kathrynn Running;   dx 02/ 2024,  Gleason 4+3   Microcytosis 2020   previously followed by hematology/ oncologist--- dr Leonides Schanz (released per lov note 02-21-2022)  no anemia;   hx macrocytosis with mild anemia been followed since 2020 macrocytosis / anemia resolved 06/ 2022   OA (osteoarthritis)    knees   Thyroid nodule    left thyroid nodule,  bx 01-09-2020  benign,  followed by pcp   Wears glasses     Past Surgical History:  Procedure Laterality Date   COLONOSCOPY  2019   ESOPHAGOGASTRODUODENOSCOPY  2014    GOLD SEED IMPLANT N/A 02/07/2023   Procedure: GOLD SEED IMPLANT;  Surgeon: Jerilee Field, MD;  Location: [facility];  Service: Urology;  Laterality: N/A;   PERIPHERAL VASCULAR THROMBECTOMY N/A 07/19/2023   Procedure: MECHANICAL VENOUS THROMBECTOMY;  Surgeon:  Victorino Sparrow, MD;  Location: [facility] INVASIVE CV LAB;  Service: Cardiovascular;  Laterality: N/A;   PERIPHERAL VASCULAR ULTRASOUND/IVUS  07/19/2023   Procedure: Peripheral Vascular Ultrasound/IVUS;  Surgeon: Victorino Sparrow, MD;  Location[facility] INVASIVE CV LAB;  Service: Cardiovascular;;   SPACE OAR INSTILLATION N/A 02/07/2023   Procedure: SPACE OAR INSTILLATION;  Surgeon: Jerilee Field, MD;  Location: [facility];  Service: Urology;  Laterality: N/A;   UVULOPALATOPHARYNGOPLASTY (UPPP)/TONSILLECTOMY/SEPTOPLASTY Bilateral 01/12/2009   @[facility]  dr Haroldine Laws;   bilateral tonsillecotmy /  septal reconstruction/  tubinate reduction     Current Outpatient Medications on File Prior to Visit  Medication Sig Dispense Refill   apixaban (ELIQUIS) 5 MG TABS tablet Take 2 tablets (10mg ) twice daily for 7 days, then 1 tablet (5mg ) twice daily 60 tablet 6   Ascorbic Acid (VITAMIN C) 1000 MG tablet Take 1,000 mg by mouth in the morning.     Berberine Chloride (BERBERINE HCI PO) Take 1 capsule by mouth daily.     Cholecalciferol (VITAMIN D3) 25 MCG (1000 UT) CAPS Take 1 capsule by mouth in the morning.     Glucosamine-Chondroit-Calcium (TRIPLE FLEX BONE & JOINT PO) Take 1 capsule by mouth in the morning.     Magnesium Citrate 200 MG TABS as directed Orally     Omega-3 Fatty Acids (FISH OIL PO) Take 1 capsule by mouth in the morning.     Red Yeast Rice Extract (RED YEAST RICE PO) Take 1 capsule by mouth 2 (two) times daily.     Turmeric 500 MG CAPS Take 1 capsule by mouth 2 (two) times daily.     No current facility-administered medications on file prior to visit.    Allergies  Allergen Reactions   Azithromycin Rash    Penicillins Rash     DIAGNOSTIC DATA (LABS, IMAGING, TESTING) - I reviewed patient records, labs, notes, testing and imaging myself where available.  Lab Results  Component Value Date   WBC 8.1 07/09/2023   HGB 13.3 07/19/2023   HCT 39.0 07/19/2023   MCV 100.0 07/09/2023   PLT 108 (L) 07/09/2023      Component Value Date/Time   NA 136 07/19/2023 1132   K 3.9 07/19/2023 1132   CL 101 07/19/2023 1132   CO2 23 07/09/2023 1009   GLUCOSE 122 (H) 07/19/2023 1132   BUN 23 07/19/2023 1132   CREATININE 1.00 07/19/2023 1132   CREATININE 1.17 02/21/2022 1434   CALCIUM 10.2 07/09/2023 1009   PROT 6.6 02/21/2022 1434   PROT 6.5 08/16/2019 0811   ALBUMIN 4.1 02/21/2022 1434   ALBUMIN 4.3 08/16/2019 0811   AST 21 02/21/2022 1434   ALT 19 02/21/2022 1434   ALKPHOS 81 02/21/2022 1434   BILITOT 1.0 02/21/2022 1434   GFRNONAA >60 07/09/2023 1009   GFRNONAA >60 02/21/2022 1434   GFRAA >60 02/12/2020 1032   Lab Results  Component Value Date   CHOL 183 08/16/2019   HDL 69 08/16/2019   LDLCALC 103 (H) 08/16/2019   TRIG 59 08/16/2019   CHOLHDL 2.7 08/16/2019   No results found for: "HGBA1C" Lab Results  Component Value Date   VITAMINB12 505 07/12/2019   Lab Results  Component Value Date   TSH 0.893 07/12/2019     MRI : frontal temporal atrophy with sulci enlargement more dominant, overall mild atrophy-  small  vessel disease.   PHYSICAL EXAM:  Today's Vitals   11/27/23 1002  BP: (!) 141/73  Pulse: 81  Weight: 240 lb (  108.9 kg)  Height: 5\' 9"  (1.753 m)   Body mass index is 35.44 kg/m.   Wt Readings from Last 3 Encounters:  11/27/23 240 lb (108.9 kg)  08/17/23 230 lb (104.3 kg)  07/19/23 230 lb (104.3 kg)     Ht Readings from Last 3 Encounters:  11/27/23 5\' 9"  (1.753 m)  08/17/23 5\' 9"  (1.753 m)  07/19/23 5\' 9"  (1.753 m)      General: The patient is awake, alert and appears not in acute distress. The patient is well groomed. Head: Normocephalic, atraumatic.   Neck is supple. Mallampati 3,  neck circumference:18 inches . Nasal airflow  patent.  Retrognathia is not seen.  Dental status:  biological  Cardiovascular:  Regular rate and cardiac rhythm by pulse,  without distended neck veins. Respiratory: Lungs are clear to auscultation.  Skin:  With evidence of ankle edema-left . Trunk: The patient's posture is erect.   NEUROLOGIC EXAM: The patient is awake and alert, oriented to place and time.   Memory subjective described as impaired -     11/27/2023   10:14 AM  Montreal Cognitive Assessment   Visuospatial/ Executive (0/5) 3  Naming (0/3) 3  Attention: Read list of digits (0/2) 2  Attention: Read list of letters (0/1) 1  Attention: Serial 7 subtraction starting at 100 (0/3) 1  Language: Repeat phrase (0/2) 1  Language : Fluency (0/1) 1  Abstraction (0/2) 2  Delayed Recall (0/5) 2  Orientation (0/6) 5  Total 21        No data to display         His PCP had done a memory test ( MMSE)  in her office  - but I can't see the result.   Attention span & concentration ability appears normal.  Speech is fluent,  without  dysarthria, dysphonia or aphasia.  Mood and affect are appropriate. He is a bit reluctant to engage, but friendly, sceptic.    Cranial nerves:  Pupils are equal in shape and size and briskly reactive to light. No cataract -  Ptosis, upward gaze impaired by heavy eyelids.  Extraocular movements in vertical and horizontal planes were intact and without nystagmus.  No Diplopia reported. Visual fields by finger perimetry are intact. Hearing was impaired to soft voice , the patient speaks louder than needed for this environment.      Facial sensation intact to fine touch.  Facial motor strength is symmetric and tongue in midline. UPPP.  Neck ROM : rotation, tilt and flexion extension were normal for age and shoulder shrug was symmetrical.    Motor exam:  Symmetric bulk, tone and ROM.   Normal tone without cog wheeling,  symmetric grip strength- weakness, I was certainly surprised by his weakness in grip strength.   Sensory:  Fine touch and vibration were  intact at the patella and right ankle , left ankle is swollen.  Proprioception tested in the upper extremities was normal.    Coordination: Rapid alternating movements in the fingers/hands were of normal speed.  The Finger-to-nose maneuver was intact without evidence of ataxia, dysmetria - but there was action tremor- close to the target- satellitic  tremor.   Gait and station: Patient could rise unassisted from a seated position, walked without assistive device.  Stance is of normal width/ base and the patient turned with 3 steps.  Toe and heel walk were deferred.  Deep tendon reflexes: in the  upper and lower extremities are symmetric and intact.  ASSESSMENT AND PLAN 73 y.o. year old married  male patient of [place] descent , here to address what he considers his wife's concerns about his memory.      1) ST memory loss, MCI by MOCA result.  He is not receptive to this dx.  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   STM was also affected, he recalled 2 out of 5 words. Good word fluency with normal word finding, naming, repetition. Eloquent in conversation.   Was off date by one day, but otherwise fully oriented.   No visio-spatial difficulties  ( cube drawing/ clock face's numbers were spaced correctly) but placed the hands on the clock face incorrectly and at similar length.  He mastered the trail making test.     2) reviewed the patients extensive lab work from a recent visit with Dr Cliffton Asters and his MRI  was reviewed with him and his wife  here in the room. No obvious signs of CVA, few lacunes, microvascular disease- some atrophy which I consider more dominant in the  temporal lobes. We looked at the MRI together, there is atrophy and I see widening of the sulci at the temporal lobe, I am not concerned about  the lacunes.    3) Hot flashes with Hormone therapy - prostate cancer treatment , these have caused him to sleep poorly , yet he sleeps longer- he also stays in bed longer , estimated 10 hours plus, is snoring loudly- wife reports having not witnessed apnea,  he was OSA tested in 2017- had decided then to undergo a UPPP and reports a follow up  PSG  test showed no apnea.  He may have been significantly slimmer at the time- and he still snored.  He gasps for air- wouldn't that mean apnea?  Wife seems not assertive enough to state what she witnesses-  again, PSG last checked about 3 years ago. Reportedly Negative.  Wants not to look into sleep apnea again, while wife stated snoring is loud and would certainly like him retested. He has a high grade Mallampati and large neck, post UPPP patient.      DX MCI, mild cognitive impairment STM and acalculia.   He is very reluctant to even consider that his memory is impaired and found many explanations for the anecdotal evidence his wife provided ( you called me when I was busy" or " I was just on the pickle ball court when X mentioned something and couldn't clarify what was meant:. Or simply : " that never happened".    He sees no need for further testing, for medication or other treatment.   I discussed the benefits of early AD testing and early treatment of AD and offered  Aricept / he stated no interest but his wife did consider Aricept, he will think about it at home    He is also not interested in ATN/ Alzheimer Disease genetic testing -I explained this would be the basis for MAB infusion therapy - and a MMSE test above or at 23/ 30 points. He again stated he didn't want to find out if he would have it.- no PET ordered after this discussion.  I did not refer to neuropsychology at this time as the patient will not  follow up.   I see no  need for any restriction- he is a careful Publishing rights manager- he does online banking according to him and his wife's report without problems. He uses a calculator when he pays bills and monthly he will pay -off the  credit card- He is not using debit cards  in daily life. The couple has not set limits on unusual money transfers with their bank, but both felt this as not a bad idea. It will require a phone call to son or spouse to protect from unusual money movements  induced by fraud or scamming .    He feels competent in driving  and independent  in ADL.  He  handles himself well in company, and the couple enjoys a Engineer, site.   I offered PRN RV if the patient develops concerns ( or his wife ) about memory function/ dysfunction. I attached a brief introduction into  memory strategies, lifestyle : we briefly discussed the MIND diet and the blue zone theories,  and I attached an info into Aricept.   I would like to thank [name], MD (and cc to  Dr Myra Gianotti, vascular services, and Dr Cardell Peach, Urologist), for allowing me to meet with this pleasant patient.     After spending a total time of  55  minutes face to face and additional time for physical and neurologic examination, review of laboratory studies,  personal review of imaging studies, reports and results of other testing and review of referral information / records as far as provided in visit.   Electronically signed by: Melvyn Novas, MD 11/27/2023 10:26 AM  [facility] Board certified by The [employer] of Sleep Medicine and Diplomate of the [employer] of Sleep Medicine. Board certified In Neurology through the ABPN, Fellow of the [employer] of Neurology.

## 2023-11-27 NOTE — Patient Instructions (Addendum)
 Memory Compensation Strategies  Use "WARM" strategy.  W= write it down  A= associate it  R= repeat it  M= make a mental note  2.   You can keep a Glass blower/designer.  Use a 3-ring notebook with sections for the following: calendar, important names and phone numbers,  medications, doctors' names/phone numbers, lists/reminders, and a section to journal what you did  each day.   3.    Use a calendar to write appointments down.  4.    Write yourself a schedule for the day.  This can be placed on the calendar or in a separate section of the Memory Notebook.  Keeping a  regular schedule can help memory.  5.    Use medication organizer with sections for each day or morning/evening pills.  You may need help loading it  6.    Keep a basket, or pegboard by the door.  Place items that you need to take out with you in the basket or on the pegboard.  You may also want to  include a message board for reminders.  7.    Use sticky notes.  Place sticky notes with reminders in a place where the task is performed.  For example: " turn off the  stove" placed by the stove, "lock the door" placed on the door at eye level, " take your medications" on  the bathroom mirror or by the place where you normally take your medications.  8.    Use alarms/timers.  Use while cooking to remind yourself to check on food or as a reminder to take your medicine, or as a  reminder to make a call, or as a reminder to perform another task, etc.   MIND Diet-  There are well-accepted and sensible ways to reduce risk for Alzheimers disease and other degenerative brain disorders .  Exercise Daily Walk A daily 20 minute walk should be part of your routine. Disease related apathy can be a significant roadblock to exercise and the only way to overcome this is to make it a daily routine and perhaps have a reward at the end (something your loved one loves to eat or drink perhaps) or a personal trainer coming to the home can also be very  useful. Most importantly, the patient is much more likely to exercise if the caregiver / spouse does it with him/her. In general a structured, repetitive schedule is best.  General Health: Any diseases which effect your body will effect your brain such as a pneumonia, urinary infection, blood clot, heart attack or stroke. Keep contact with your primary care doctor for regular follow ups.  Sleep. A good nights sleep is healthy for the brain. Seven hours is recommended. If you have insomnia or poor sleep habits we can give you some instructions. If you have sleep apnea wear your mask.  Diet: Eating a heart healthy diet is also a good idea; fish and poultry instead of red meat, nuts (mostly non-peanuts), vegetables, fruits, olive oil or canola oil (instead of butter), minimal salt (use other spices to flavor foods), whole grain rice, bread, cereal and pasta and wine in moderation.Research is now showing that the MIND diet, which is a combination of The Mediterranean diet and the DASH diet, is beneficial for cognitive processing and longevity. Information about this diet can be found in The MIND Diet, a book by Alonna Minium, MS, RDN, and online at WildWild[employer].es  Finances, Power of [address] and Advance Directives: You should consider putting  legal safeguards in place with regard to financial and medical decision making. While the spouse always has power of attorney for medical and financial issues in the absence of any form, you should consider what you want in case the spouse / caregiver is no longer around or capable of making decisions.   The Alzheimers Association Position on Disease Prevention  Can Alzheimer's be prevented? It's a question that continues to intrigue researchers and fuel new investigations. There are no clear-cut answers yet -- partially due to the need for more large-scale studies in diverse populations -- but promising research is under way. The  Alzheimer's Association is leading the worldwide effort to find a treatment for Alzheimer's, delay its onset and prevent it from developing.   What causes Alzheimer's? Experts agree that in the vast majority of cases, Alzheimer's, like other common chronic conditions, probably develops as a result of complex interactions among multiple factors, including age, genetics, environment, lifestyle and coexisting medical conditions. Although some risk factors -- such as age or genes -- cannot be changed, other risk factors -- such as high blood pressure and lack of exercise -- usually can be changed to help reduce risk. Research in these areas may lead to new ways to detect those at highest risk.  Prevention studies A small percentage of people with Alzheimer's disease (less than 1 percent) have an early-onset type associated with genetic mutations. Individuals who have these genetic mutations are guaranteed to develop the disease. An ongoing clinical trial conducted by the [facility] ([facility]), is testing whether antibodies to beta-amyloid can reduce the accumulation of beta-amyloid plaque in the brains of people with such genetic mutations and thereby reduce, delay or prevent symptoms. Participants in the trial are receiving antibodies (or placebo) before they develop symptoms, and the development of beta-amyloid plaques is being monitored by brain scans and other tests.  Another clinical trial, known as the A4 trial (Anti-Amyloid Treatment in Asymptomatic Alzheimer's), is testing whether antibodies to beta-amyloid can reduce the risk of Alzheimer's disease in older people (ages 24 to 43) at high risk for the disease. The A4 trial is being conducted by the Alzheimer's Disease Cooperative Study.  Though research is still evolving, evidence is strong that people can reduce their risk by making key lifestyle changes, including participating in regular activity and maintaining good heart  health. Based on this research, the Alzheimer's Association offers 10 Ways to Love Your Brain -- a collection of tips that can reduce the risk of cognitive decline.  Heart-head connection  New research shows there are things we can do to reduce the risk of mild cognitive impairment and dementia.  Several conditions known to increase the risk of cardiovascular disease -- such as high blood pressure, diabetes and high cholesterol -- also increase the risk of developing Alzheimer's. Some autopsy studies show that as many as 80 percent of individuals with Alzheimer's disease also have cardiovascular disease.  A longstanding question is why some people develop hallmark Alzheimer's plaques and tangles but do not develop the symptoms of Alzheimer's. Vascular disease may help researchers eventually find an answer. Some autopsy studies suggest that plaques and tangles may be present in the brain without causing symptoms of cognitive decline unless the brain also shows evidence of vascular disease. More research is needed to better understand the link between vascular health and Alzheimer's.  Physical exercise and diet Regular physical exercise may be a beneficial strategy to lower the risk of Alzheimer's and vascular dementia. Exercise  may directly benefit brain cells by increasing blood and oxygen flow in the brain. Because of its known cardiovascular benefits, a medically approved exercise program is a valuable part of any overall wellness plan.  Current evidence suggests that heart-healthy eating may also help protect the brain. Heart-healthy eating includes limiting the intake of sugar and saturated fats and making sure to eat plenty of fruits, vegetables, and whole grains. No one diet is best. Two diets that have been studied and may be beneficial are the DASH (Dietary Approaches to Stop Hypertension) diet and the Mediterranean diet. The DASH diet emphasizes vegetables, fruits and fat-free or low-fat dairy  products; includes whole grains, fish, poultry, beans, seeds, nuts and vegetable oils; and limits sodium, sweets, sugary beverages and red meats. A Mediterranean diet includes relatively little red meat and emphasizes whole grains, fruits and vegetables, fish and shellfish, and nuts, olive oil and other healthy fats.  Social connections and intellectual activity A number of studies indicate that maintaining strong social connections and keeping mentally active as we age might lower the risk of cognitive decline and Alzheimer's. Experts are not certain about the reason for this association. It may be due to direct mechanisms through which social and mental stimulation strengthen connections between nerve cells in the brain.  Head trauma There appears to be a strong link between future risk of Alzheimer's and serious head trauma, especially when injury involves loss of consciousness. You can help reduce your risk of Alzheimer's by protecting your head.  Wear a seat belt  Use a helmet when participating in sports  "Fall-proof" your home   What you can do now While research is not yet conclusive, certain lifestyle choices, such as physical activity and diet, may help support brain health and prevent Alzheimer's. Many of these lifestyle changes have been shown to lower the risk of other diseases, like heart disease and diabetes, which have been linked to Alzheimer's. With few drawbacks and plenty of known benefits, healthy lifestyle choices can improve your health and possibly protect your brain.  Learn more about brain health. You can help increase our knowledge by considering participation in a clinical study. Our free clinical trial matching services, TrialMatch, can help you find clinical trials in your area that are seeking volunteers.  Understanding prevention research Here are some things to keep in mind about the research underlying much of our current knowledge about possible prevention:   Insights about potentially modifiable risk factors apply to large population groups, not to individuals. Studies can show that factor X is associated with outcome Y, but cannot guarantee that any specific person will have that outcome. As a result, you can "do everything right" and still have a serious health problem or "do everything wrong" and live to be 100.  Much of our current evidence comes from large epidemiological studies such as the [place] Aging Study, the Nurses' Health Study, the Adult Changes in Thought Study and the [employer]. These studies explore pre-existing behaviors and use statistical methods to relate those behaviors to health outcomes. This type of study can show an "association" between a factor and an outcome but cannot "prove" cause and effect. This is why we describe evidence based on these studies with such language as "suggests," "may show," "might protect," and "is associated with."  The gold standard for showing cause and effect is a clinical trial in which participants are randomly assigned to a prevention or risk management strategy or a control group. Researchers follow the two  groups over time to see if their outcomes differ significantly.  It is unlikely that some prevention or risk management strategies will ever be tested in randomized trials for ethical or practical reasons. One example is exercise. Definitively testing the impact of exercise on Alzheimer's risk would require a huge trial enrolling thousands of people and following them for many years. The expense and logistics of such a trial would be prohibitive, and it would require some people to go without exercise, a known health benefit.     Problems With Thinking and Memory (Mild Neurocognitive Disorder): What to Know Mild neurocognitive disorder, formerly known as mild cognitive impairment, is a disorder where your memory doesn't work as well as it should. It may also affect other mental  abilities like thinking, communicating, behavior, and being able to finish tasks. These problems can be noticed and measured. But they usually don't stop you from doing daily activities or living on your own. Mild neurocognitive disorder usually happens after 75 years of age. But it can also happen at younger ages. It's not as serious as major neurocognitive disorder, also known as dementia, but it may be the first sign of it. In general, the symptoms of this condition get worse over time. In rare cases, symptoms can get better. What are the causes? This condition may be caused by: Brain disorders like Alzheimer's disease, Parkinson's disease, and other conditions that slowly damage nerve cells. Diseases that affect the blood vessels in the brain and cause small strokes. Certain infections, like HIV. Traumatic brain injury. Other medical conditions, such as brain tumors, underactive thyroid (hypothyroidism), and not having enough vitamin B12. Using certain drugs or medicines. What increases the risk? Being older than 75 years of age. Being male. Having a lower level of education. Diabetes, high blood pressure, high cholesterol, and other conditions that raise the risk for blood vessel diseases. Untreated or undertreated sleep apnea. Having a certain type of gene that can be inherited, or passed down from parent to child. Long-term health problems like heart disease, lung disease, liver disease, kidney disease, or depression. What are the signs or symptoms? Trouble remembering things. You may: Forget names, phone numbers, or details of recent events. Forget about social events and appointments. Often forget where you put your car keys or other items. Trouble thinking and solving problems. You may have trouble with complex tasks like: Paying bills. Driving in places you don't know well. Trouble communicating. You may have trouble: Finding the right word or naming an object. Forming a  sentence that makes sense. Understanding what you read or hear. Changes in your behavior or personality. When this happens, you may: Lose interest in the things you used to enjoy. Avoid being around people. Get angry more easily than usual. Act before thinking. How is this diagnosed? This condition is diagnosed based on: Your symptoms. Your health care provider may ask you and the people you spend time with, like family and friends, about your symptoms. Memory tests and other tests to check how your brain is working. Your provider may refer you to a provider called a neurologist or a mental health specialist. To try to find out the cause of your condition, your provider may: Get a detailed medical history. Ask about use of alcohol, drugs, and medicines. Do a physical exam. Order blood tests and brain imaging tests. How is this treated? Mild neurocognitive disorder that's caused by medicine use, drug use, infection, or another medical condition may get better when the cause  is treated, or when medicines or drugs are stopped. If this disorder has another cause, it usually doesn't improve and may get worse. In these cases, the goal of treatment is to help you manage the symptoms. This may include: Medicines to help with memory and behavior symptoms. Talk therapy. This provides education, emotional support, memory aids, and other ways of making up for problems with mental tasks. Lifestyle changes. These may include: Getting regular exercise. Eating a healthy diet that includes omega-3 fatty acids. Doing things to challenge your thinking and memory skills. Spending more time being with and talking to other people. Using routines like having regular times for meals and going to bed. Follow these instructions at home: Eating and drinking  Drink more fluids as told. Eat a healthy diet that includes omega-3 fatty acids. These can be found in: Fish. Nuts. Leafy vegetables. Vegetable  oils. If you drink alcohol: Limit how much you have to: 0-1 drink a day if you're male. 0-2 drinks a day if you're male. Know how much alcohol is in your drink. In the [place] one drink is one 12 oz bottle of beer (355 mL), one 5 oz glass of wine (148 mL), or one 1 oz glass of hard liquor (44 mL). Lifestyle  Get regular exercise as told by your provider. Do not smoke, vape, or use nicotine or tobacco. Use healthy ways to manage stress. If you need help managing stress, ask your provider. Keep spending time with other people. Keep your mind active by doing activities you enjoy, like reading or playing games. Make sure you get good sleep at night. These tips can help: Try not to take naps during the day. Keep your bedroom dark and cool. Do not exercise in the few hours before you go to bed. Do not have foods or drinks with caffeine at night. General instructions Take medicines only as told. Your provider may tell you to avoid taking medicines that can affect thinking. These include some medicines for pain or sleeping. Work with your provider to find out: What things you need help with. What your safety needs are. Where to find more information [employer] on Aging: [email or web address] Contact a health care provider if: You have any new symptoms. Get help right away if: You have new confusion or your confusion gets worse. You act in ways that put you or your family in danger. This information is not intended to replace advice given to you by your health care provider. Make sure you discuss any questions you have with your health care provider. Document Revised: 02/14/2023 Document Reviewed: 02/14/2023 Elsevier Patient Education  2024 Elsevier Inc. ARICEPT -  Donepezil Tablets What is this medication? DONEPEZIL (doe NEP e zil) treats memory loss and confusion (dementia) in people who have Alzheimer disease. It works by improving attention, memory, and the ability to engage in daily  activities. It is not a cure for dementia or Alzheimer disease. This medicine may be used for other purposes; ask your health care provider or pharmacist if you have questions. COMMON BRAND NAME(S): Aricept What should I tell my care team before I take this medication? They need to know if you have any of these conditions: Head injury Heart disease Irregular heartbeat or rhythm Liver disease Lung or breathing disease, such as asthma Seizures Stomach ulcers, other stomach or intestine problems Stomach bleeding Trouble passing urine An unusual or allergic reaction to donepezil, other medications, foods, dyes, or preservatives Pregnant or trying to get pregnant Breastfeeding How  should I use this medication? Take this medication by mouth with a glass of water. Follow the directions on the prescription label. You may take this medication with or without food. Take this medication at regular intervals. This medication is usually taken before bedtime. Do not take it more often than directed. Continue to take your medication even if you feel better. Do not stop taking except on your care team's advice. If you are taking the 23 mg donepezil tablet, swallow it whole; do not cut, crush, or chew it. Talk to your care team about the use of this medication in children. Special care may be needed. Overdosage: If you think you have taken too much of this medicine contact a poison control center or emergency room at once. NOTE: This medicine is only for you. Do not share this medicine with others. What if I miss a dose? If you miss a dose, take it as soon as you can. If it is almost time for your next dose, take only that dose, do not take double or extra doses. What may interact with this medication? Do not take this medication with any of the following: Certain medications for fungal infections, such as itraconazole, fluconazole, posaconazole, voriconazole Cisapride Dextromethorphan;  quinidine Dronedarone Pimozide Quinidine Thioridazine This medication may also interact with the following: Antihistamines for allergy, cough, and cold Atropine Bethanechol Carbamazepine Certain medications for bladder problems, such as oxybutynin or tolterodine Certain medications for Parkinson disease, such as benztropine or trihexyphenidyl Certain medications for stomach problems, such as dicyclomine or hyoscyamine Certain medications for travel sickness, such as scopolamine Dexamethasone Dofetilide Ipratropium NSAIDs, medications for pain and inflammation, such as ibuprofen or naproxen Other medications for Alzheimer disease Other medications that cause heart rhythm changes Phenobarbital Phenytoin Rifampin, rifabutin, or rifapentine Ziprasidone This list may not describe all possible interactions. Give your health care provider a list of all the medicines, herbs, non-prescription drugs, or dietary supplements you use. Also tell them if you smoke, drink alcohol, or use illegal drugs. Some items may interact with your medicine. What should I watch for while using this medication? Visit your care team for regular checks on your progress. Tell your care team if your symptoms do not start to get better or if they get worse. This medication may affect your coordination, reaction time, or judgment. Do not drive or operate machinery until you know how this medication affects you. Sit up or stand slowly to reduce the risk of dizzy or fainting spells. Drinking alcohol with this medication can increase the risk of these side effects. What side effects may I notice from receiving this medication? Side effects that you should report to your care team as soon as possible: Allergic reactions--skin rash, itching, hives, swelling of the face, lips, tongue, or throat Peptic ulcer--burning stomach pain, loss of appetite, bloating, burping, heartburn, nausea, vomiting Seizures Slow  heartbeat--dizziness, feeling faint or lightheaded, confusion, trouble breathing, unusual weakness or fatigue Stomach bleeding--bloody or black, tar-like stools, vomiting blood or brown material that looks like coffee grounds Trouble passing urine Side effects that usually do not require medical attention (report these to your care team if they continue or are bothersome): Diarrhea Fatigue Loss of appetite Muscle pain or cramps Nausea Trouble sleeping This list may not describe all possible side effects. Call your doctor for medical advice about side effects. You may report side effects to FDA at 1-800-FDA-1088. Where should I keep my medication? Keep out of reach of children. Store at room temperature between  15 and 30 degrees C (59 and 86 degrees F). Throw away any unused medication after the expiration date. NOTE: This sheet is a summary. It may not cover all possible information. If you have questions about this medicine, talk to your doctor, pharmacist, or health care provider.  2024 Elsevier/Gold Standard (2023-03-02 00:00:00)

## 2023-12-02 ENCOUNTER — Encounter: Payer: Self-pay | Admitting: Neurology

## 2023-12-02 LAB — ATN PROFILE
A -- Beta-amyloid 42/40 Ratio: 0.107 (ref >0.102)
Beta-amyloid 40: 182.41 pg/mL
Beta-amyloid 42: 19.5 pg/mL
N -- NfL, Plasma: 2.42 pg/mL (ref 0.00–7.64)
T -- p-tau181: 0.79 pg/mL (ref 0.00–0.97)

## 2023-12-02 LAB — APOE ALZHEIMER'S RISK

## 2023-12-27 ENCOUNTER — Ambulatory Visit: Payer: Medicare Other | Admitting: Neurology

## 2024-01-17 ENCOUNTER — Ambulatory Visit: Payer: Medicare Other | Admitting: Neurology

## 2024-05-30 DIAGNOSIS — E785 Hyperlipidemia, unspecified: Secondary | ICD-10-CM | POA: Diagnosis not present

## 2024-05-30 DIAGNOSIS — D7589 Other specified diseases of blood and blood-forming organs: Secondary | ICD-10-CM | POA: Diagnosis not present

## 2024-05-30 DIAGNOSIS — R7301 Impaired fasting glucose: Secondary | ICD-10-CM | POA: Diagnosis not present

## 2024-06-03 DIAGNOSIS — Z8719 Personal history of other diseases of the digestive system: Secondary | ICD-10-CM | POA: Diagnosis not present

## 2024-06-03 DIAGNOSIS — Z0000 Encounter for general adult medical examination without abnormal findings: Secondary | ICD-10-CM | POA: Diagnosis not present

## 2024-06-03 DIAGNOSIS — R7301 Impaired fasting glucose: Secondary | ICD-10-CM | POA: Diagnosis not present

## 2024-06-03 DIAGNOSIS — R931 Abnormal findings on diagnostic imaging of heart and coronary circulation: Secondary | ICD-10-CM | POA: Diagnosis not present

## 2024-06-03 DIAGNOSIS — Z86018 Personal history of other benign neoplasm: Secondary | ICD-10-CM | POA: Diagnosis not present

## 2024-06-03 DIAGNOSIS — G3184 Mild cognitive impairment, so stated: Secondary | ICD-10-CM | POA: Diagnosis not present

## 2024-06-03 DIAGNOSIS — E785 Hyperlipidemia, unspecified: Secondary | ICD-10-CM | POA: Diagnosis not present

## 2024-06-03 DIAGNOSIS — E041 Nontoxic single thyroid nodule: Secondary | ICD-10-CM | POA: Diagnosis not present

## 2024-06-11 DIAGNOSIS — Z86018 Personal history of other benign neoplasm: Secondary | ICD-10-CM | POA: Diagnosis not present

## 2024-10-08 ENCOUNTER — Ambulatory Visit: Admitting: Neurology

## 2024-10-08 ENCOUNTER — Encounter: Payer: Self-pay | Admitting: Neurology

## 2024-10-08 VITALS — BP 136/73 | HR 74 | Ht 69.0 in | Wt 245.0 lb

## 2024-10-08 DIAGNOSIS — R4189 Other symptoms and signs involving cognitive functions and awareness: Secondary | ICD-10-CM | POA: Diagnosis not present

## 2024-10-08 DIAGNOSIS — G3109 Other frontotemporal dementia: Secondary | ICD-10-CM

## 2024-10-08 DIAGNOSIS — F0280 Dementia in other diseases classified elsewhere without behavioral disturbance: Secondary | ICD-10-CM | POA: Diagnosis not present

## 2024-10-08 DIAGNOSIS — R413 Other amnesia: Secondary | ICD-10-CM

## 2024-10-08 DIAGNOSIS — G309 Alzheimer's disease, unspecified: Secondary | ICD-10-CM | POA: Diagnosis not present

## 2024-10-08 NOTE — Addendum Note (Signed)
 Addended by: CHALICE SAUNAS on: 10/08/2024 03:37 PM   Modules accepted: Orders

## 2024-10-08 NOTE — Progress Notes (Signed)
 "        Provider:  Dedra Gores, MD  Primary Care Physician:  Teresa Channel, MD [phone] [address] [place] [address]     Referring Provider: Teresa Channel, Md [phone] [address] [place],  [place] [address]          Chief Complaint according to patient   Patient presents with:                HISTORY OF PRESENT ILLNESS:  Casey Casey Mullen is a 74 y.o. male patient who is here for revisit 10/08/2024 for memory, he is actually re-referred by dr white  for memory testing.  Last year ATN was negative, Apo E4 one allel positive. MOCA was 21/ 30 points.  Normal brain MRI.Remote lacunar infarcts in the basal ganglia. No evidence of remote cortical infarct. DVT on Eloquis.  His short term memory has further declined. He still tried to pay all bills, an is mixing them up.  ADL : he forgot PINs and passwords. He has been locked out of accounts. His wife no longer lets him drive alone, but he is driving. He has been traced by smart phone- . He has not gotten lost.  He is easily agitated, defensive.     His wife stated he sleeps 12 hours a day and takes additional naps, he endorsed the Epworth sleepiness score at 6, his wife would  endorse it at 14 /24.  FSS at 9/ 63 .       Chief concern according to patient's wife  :  he is sleeping and resting all day and sleeps all night.    Has not been seen by neuropsychologist , and needs to be.  He also will be screened for sleep apnea by a HST  He refuses to consider Aricept or Namenda.     Fam Hx :   Social HX; Pt's post -tx PSA was 0.056 in October, 2024. Pt still has nocturia 3-4x nightly. He also has hotflashes from taking the Orgovyx. Pt is still keeping active playing pickleball daily which he enjoys. Last colonoscopy was 12/2015. Cologuard test done 05/2023.        Review of Systems: Out of a complete 14 system review, the patient complains of only the following symptoms, and all other reviewed systems are  negative.:     10/08/2024    2:29 PM 11/27/2023   10:14 AM  Montreal Cognitive Assessment   Visuospatial/ Executive (0/5) 3 3  Naming (0/3) 3 3  Attention: Read list of digits (0/2) 1 2  Attention: Read list of letters (0/1) 1 1  Attention: Serial 7 subtraction starting at 100 (0/3) 3 1  Language: Repeat phrase (0/2) 0 1  Language : Fluency (0/1) 0 1  Abstraction (0/2) 1 2  Delayed Recall (0/5) 0 2  Orientation (0/6) 3 5  Total 15 21        Social History   Socioeconomic History   Marital status: Married    Spouse name: Casey Mullen    Number of children: Not on file   Years of education: Not on file   Highest education level: Not on file  Occupational History   Not on file  Tobacco Use   Smoking status: Never   Smokeless tobacco: Never  Vaping Use   Vaping status: Never Used  Substance and Sexual Activity   Alcohol use: Not Currently   Drug use: Never   Sexual activity: Not on file  Other Topics Concern   Not on file  Social History Narrative   Not on file   Social Drivers of Health   Tobacco Use: Low Risk (10/08/2024)   Patient History    Smoking Tobacco Use: Never    Smokeless Tobacco Use: Never    Passive Exposure: Not on file  Financial Resource Strain: Not on file  Food Insecurity: No Food Insecurity (11/18/2022)   Hunger Vital Sign    Worried About Running Out of Food in the Last Year: Never true    Ran Out of Food in the Last Year: Never true  Transportation Needs: No Transportation Needs (11/18/2022)   PRAPARE - Administrator, Civil Service (Medical): No    Lack of Transportation (Non-Medical): No  Physical Activity: Not on file  Stress: Not on file  Social Connections: Not on file  Depression (PHQ2-9): Low Risk (11/18/2022)   Depression (PHQ2-9)    PHQ-2 Score: 0  Alcohol Screen: Low Risk (11/18/2022)   Alcohol Screen    Last Alcohol Screening Score (AUDIT): 0  Housing: Low Risk (11/18/2022)   Housing    Last Housing Risk Score: 0   Utilities: Not At Risk (11/18/2022)   AHC Utilities    Threatened with loss of utilities: No  Health Literacy: Not on file    Family History  Problem Relation Age of Onset   Kidney failure Father    Nephrolithiasis Father    Hypertension Sister    Hypertension Brother     Past Medical History:  Diagnosis Date   BPH without obstruction/lower urinary tract symptoms    Coronary artery calcification seen on CAT scan 2020   previously seen by dr berry-- lov note in epic 09-18-2020 ,  cardiac CT 11-02-2018  calcium score=785 involving RCA and LAD   ED (erectile dysfunction)    GERD (gastroesophageal reflux disease)    History of gastric ulcer 2014   History of kidney stones    History of obstructive sleep apnea 2010   (01-24-2023  has another sleep study approx 3-4 yrs ago,  show no OSA)   sleep study in epic 11-21-2008 moderate osa , AHI 30.4/ hr,  recommended cpap,  pt had surgery s/p 01-12-2009  UPPP/ septoplasty/ tonsillectomy   Hyperlipidemia    Malignant neoplasm prostate (HCC) 10/2022   urologist--- dr gay/  radiation oncologist--  dr patrcia;   dx 02/ 2024,  Gleason 4+3   Microcytosis 2020   previously followed by hematology/ oncologist--- dr federico (released per lov note 02-21-2022)  no anemia;   hx macrocytosis with mild anemia been followed since 2020 macrocytosis / anemia resolved 06/ 2022   OA (osteoarthritis)    knees   Thyroid  nodule    left thyroid  nodule,  bx 01-09-2020  benign,  followed by pcp   Wears glasses     Past Surgical History:  Procedure Laterality Date   COLONOSCOPY  2019   ESOPHAGOGASTRODUODENOSCOPY  2014   GOLD SEED IMPLANT N/A 02/07/2023   Procedure: GOLD SEED IMPLANT;  Surgeon: Nieves Cough, MD;  Location: [facility];  Service: Urology;  Laterality: N/A;   PERIPHERAL VASCULAR THROMBECTOMY N/A 07/19/2023   Procedure: MECHANICAL VENOUS THROMBECTOMY;  Surgeon: Lanis Fonda BRAVO, MD;  Location: [facility] INVASIVE CV LAB;  Service:  Cardiovascular;  Laterality: N/A;   PERIPHERAL VASCULAR ULTRASOUND/IVUS  07/19/2023   Procedure: Peripheral Vascular Ultrasound/IVUS;  Surgeon: Lanis Fonda BRAVO, MD;  Location: [facility]VASIVE CV LAB;  Service: Cardiovascular;;   SPACE OAR  INSTILLATION N/A 02/07/2023   Procedure: SPACE OAR INSTILLATION;  Surgeon: Nieves Cough, MD;  Location: [facility];  Service: Urology;  Laterality: N/A;   UVULOPALATOPHARYNGOPLASTY (UPPP)/TONSILLECTOMY/SEPTOPLASTY Bilateral 01/12/2009   @[facility]  dr floy;   bilateral tonsillecotmy /  septal reconstruction/  tubinate reduction     Medications Ordered Prior to Encounter[1]  Allergies[2]   DIAGNOSTIC DATA (LABS, IMAGING, TESTING) - I reviewed patient records, labs, notes, testing and imaging myself where available.  Lab Results  Component Value Date   WBC 8.1 07/09/2023   HGB 13.3 07/19/2023   HCT 39.0 07/19/2023   MCV 100.0 07/09/2023   PLT 108 (L) 07/09/2023      Component Value Date/Time   NA 136 07/19/2023 1132   K 3.9 07/19/2023 1132   CL 101 07/19/2023 1132   CO2 23 07/09/2023 1009   GLUCOSE 122 (H) 07/19/2023 1132   BUN 23 07/19/2023 1132   CREATININE 1.00 07/19/2023 1132   CREATININE 1.17 02/21/2022 1434   CALCIUM 10.2 07/09/2023 1009   PROT 6.6 02/21/2022 1434   PROT 6.5 08/16/2019 0811   ALBUMIN 4.1 02/21/2022 1434   ALBUMIN 4.3 08/16/2019 0811   AST 21 02/21/2022 1434   ALT 19 02/21/2022 1434   ALKPHOS 81 02/21/2022 1434   BILITOT 1.0 02/21/2022 1434   GFRNONAA >60 07/09/2023 1009   GFRNONAA >60 02/21/2022 1434   GFRAA >60 02/12/2020 1032   Lab Results  Component Value Date   CHOL 183 08/16/2019   HDL 69 08/16/2019   LDLCALC 103 (H) 08/16/2019   TRIG 59 08/16/2019   CHOLHDL 2.7 08/16/2019   No results found for: HGBA1C Lab Results  Component Value Date   VITAMINB12 505 07/12/2019   Lab Results  Component Value Date   TSH 0.893 07/12/2019    PHYSICAL EXAM:  Vitals:   10/08/24 1427  BP:  136/73  Pulse: 74   No data found. Body mass index is 36.18 kg/m.   Wt Readings from Last 3 Encounters:  10/08/24 245 lb (111.1 kg)  11/27/23 240 lb (108.9 kg)  08/17/23 230 lb (104.3 kg)     Ht Readings from Last 3 Encounters:  10/08/24 5' 9 (1.753 m)  11/27/23 5' 9 (1.753 m)  08/17/23 5' 9 (1.753 m)      General: The patient is awake, alert and appears not in acute distress and groomed. Head: Normocephalic, atraumatic.  Neck is supple. Attention span & concentration ability appears normal.  Speech is fluent,  without  dysarthria, dysphonia or aphasia.  Mood and affect are agiated, loud, somewhat defensive and aggressive  appropriate. He is a bit reluctant to engage, but friendly, sceptic.    Cranial nerves:  Pupils are equal in shape and size and briskly reactive to light. No cataract -  Ptosis, upward gaze impaired by heavy eyelids.  Extraocular movements in vertical and horizontal planes were intact and without nystagmus.  No Diplopia reported. Visual fields by finger perimetry are intact. Hearing was impaired to soft voice , the patient speaks louder than needed for this environment.      Facial sensation intact to fine touch.  Facial motor strength is symmetric and tongue in midline. UPPP.  Neck ROM : rotation, tilt and flexion extension were normal for age and shoulder shrug was symmetrical.    Motor exam:  Symmetric bulk, tone and ROM.   Normal tone without cog wheeling, symmetric grip strength- weakness, I was certainly surprised by his weakness in grip strength.   Sensory:  Fine touch and vibration were  intact at the patella and right ankle , left ankle is swollen.  Proprioception tested in the upper extremities was normal.    Coordination: Rapid alternating movements in the fingers/hands were of normal speed.  The Finger-to-nose maneuver was intact without evidence of ataxia, dysmetria - but there was action tremor- close to the target- satellitic  tremor.   Reflexes: Normal and symmetric throughout.Gait and Station: Normal.    ASSESSMENT AND PLAN :   74 y.o. year old male  here with: non- alzheimer dementia with personality changes, increased daytime sleepiness, prolonged  sleep time day by day.  Easily angered, less relaxed, defensive and angry.      1) Cognitive impairment: He is not receptive to this dx.   Frontal lobe behaviour changes.   MOCA 15/ 30 and affected are different domaines than last year (?)   His main deficit was  STM, 0/ 5 words.   he answered the serial 7th normal- better than last year.    STM was also affected, he recalled 2 out of 5 words.  Good word fluency but affected word finding, naming, repetition.  Eloquent in conversation, but agitated.   Plan : Referral to neuropsychology.   The  patient declined Aricept and Namenda. May try PREVAGEN. PET metabolic test.    2) excessive daytime sleepiness.  I ordered HST>   RV after neuro-psychological testing.  I suspect frontal temporal dementia.  I printed an introduction into the MIND diet .      I would like to thank Teresa Channel, MD or allowing me to meet with this pleasant patient.     Any patient with sleepiness should be cautioned not to drive, work at heights, or operate dangerous or heavy equipment when feeling tired or sleepy.   The patient will be seen in follow-up in the sleep clinic at [facility] for discussion of test results, sleep related symptoms and treatment compliance review, further management strategies, etc.   The referring provider will be notified of the test results.    After spending a total time of  60  minutes face to face and time for  history taking, physical and neurologic examination, review of laboratory studies,  personal review of imaging studies, reports and results of other testing and review of referral information / records as far as provided in visit,   Electronically signed by: Dedra Gores, MD 10/08/2024 2:50  PM  [facility] Board certified by The [employer] of Sleep Medicine and Diplomate of the [employer] of [employer] Medicine. Board certified In Neurology through the ABPN, Fellow of the [employer] of Neu[employer].      [1]  Current Outpatient Medications on File Prior to Visit  Medication Sig Dispense Refill   amLODipine (NORVASC) 2.5 MG tablet Take 2.5 mg by mouth daily.     apixaban  (ELIQUIS ) 5 MG TABS tablet Take 2 tablets (10mg ) twice daily for 7 days, then 1 tablet (5mg ) twice daily 60 tablet 6   Ascorbic Acid (VITAMIN C) 1000 MG tablet Take 1,000 mg by mouth in the morning.     Cholecalciferol (VITAMIN D3) 25 MCG (1000 UT) CAPS Take 1 capsule by mouth in the morning.     Omega-3 Fatty Acids (FISH OIL PO) Take 1 capsule by mouth in the morning.     Red Yeast Rice Extract (RED YEAST RICE PO) Take 1 capsule by mouth 2 (two) times daily.     Berberine  Chloride (BERBERINE HCI PO) Take 1 capsule by mouth daily. (Patient not taking: Reported on 10/08/2024)     Glucosamine-Chondroit-Calcium (TRIPLE FLEX BONE & JOINT PO) Take 1 capsule by mouth in the morning. (Patient not taking: Reported on 10/08/2024)     Magnesium Citrate 200 MG TABS as directed Orally (Patient not taking: Reported on 10/08/2024)     Turmeric 500 MG CAPS Take 1 capsule by mouth 2 (two) times daily. (Patient not taking: Reported on 10/08/2024)     No current facility-administered medications on file prior to visit.  [2]  Allergies Allergen Reactions   Azithromycin Rash   Penicillins Rash   "

## 2024-10-09 ENCOUNTER — Telehealth: Payer: Self-pay | Admitting: Neurology

## 2024-10-09 ENCOUNTER — Ambulatory Visit: Payer: Self-pay | Admitting: Neurology

## 2024-10-09 LAB — COMPREHENSIVE METABOLIC PANEL WITH GFR
ALT: 54 [IU]/L — ABNORMAL HIGH (ref 0–44)
AST: 32 [IU]/L (ref 0–40)
Albumin: 4.5 g/dL (ref 3.8–4.8)
Alkaline Phosphatase: 113 [IU]/L (ref 47–123)
BUN/Creatinine Ratio: 16 (ref 10–24)
BUN: 17 mg/dL (ref 8–27)
Bilirubin Total: 0.9 mg/dL (ref 0.0–1.2)
CO2: 21 mmol/L (ref 20–29)
Calcium: 10.3 mg/dL — ABNORMAL HIGH (ref 8.6–10.2)
Chloride: 100 mmol/L (ref 96–106)
Creatinine, Ser: 1.06 mg/dL (ref 0.76–1.27)
Globulin, Total: 2.4 g/dL (ref 1.5–4.5)
Glucose: 153 mg/dL — ABNORMAL HIGH (ref 70–99)
Potassium: 4.4 mmol/L (ref 3.5–5.2)
Sodium: 137 mmol/L (ref 134–144)
Total Protein: 6.9 g/dL (ref 6.0–8.5)
eGFR: 73 mL/min/{1.73_m2}

## 2024-10-09 NOTE — Telephone Encounter (Signed)
 [employer] medicare shara: [id number] exp. 11/23/24 sent to [facility] [phone]

## 2024-10-23 ENCOUNTER — Encounter
